# Patient Record
Sex: Male | Born: 1975
Health system: Southern US, Community
[De-identification: ages and names within clinical notes are randomized; demographics above are authoritative.]

## PROBLEM LIST (undated history)

## (undated) DIAGNOSIS — T884XXA Failed or difficult intubation, initial encounter: Secondary | ICD-10-CM

## (undated) DIAGNOSIS — M1990 Unspecified osteoarthritis, unspecified site: Secondary | ICD-10-CM

## (undated) DIAGNOSIS — I10 Essential (primary) hypertension: Secondary | ICD-10-CM

## (undated) HISTORY — PX: KIDNEY SURGERY: SHX687

---
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
## 2019-02-06 ENCOUNTER — Emergency Department (HOSPITAL_BASED_OUTPATIENT_CLINIC_OR_DEPARTMENT_OTHER): Payer: Managed Care, Other (non HMO)

## 2019-02-06 ENCOUNTER — Other Ambulatory Visit: Payer: Self-pay

## 2019-02-06 ENCOUNTER — Emergency Department (HOSPITAL_BASED_OUTPATIENT_CLINIC_OR_DEPARTMENT_OTHER)
Admission: EM | Admit: 2019-02-06 | Discharge: 2019-02-06 | Disposition: A | Discharge status: Home / Self Care | Payer: Managed Care, Other (non HMO) | Attending: Emergency Medicine | Admitting: Emergency Medicine

## 2019-02-06 ENCOUNTER — Encounter (HOSPITAL_BASED_OUTPATIENT_CLINIC_OR_DEPARTMENT_OTHER): Payer: Self-pay

## 2019-02-06 DIAGNOSIS — M25551 Pain in right hip: Secondary | ICD-10-CM | POA: Insufficient documentation

## 2019-02-06 DIAGNOSIS — F1210 Cannabis abuse, uncomplicated: Secondary | ICD-10-CM | POA: Diagnosis not present

## 2019-02-06 DIAGNOSIS — M9140 Coxa magna, unspecified hip: Secondary | ICD-10-CM | POA: Diagnosis present

## 2019-02-06 DIAGNOSIS — R1030 Lower abdominal pain, unspecified: Secondary | ICD-10-CM | POA: Diagnosis present

## 2019-02-06 DIAGNOSIS — F17210 Nicotine dependence, cigarettes, uncomplicated: Secondary | ICD-10-CM | POA: Diagnosis not present

## 2019-02-06 DIAGNOSIS — Q6589 Other specified congenital deformities of hip: Secondary | ICD-10-CM | POA: Diagnosis not present

## 2019-02-06 DIAGNOSIS — M9141 Coxa magna, right hip: Secondary | ICD-10-CM | POA: Diagnosis not present

## 2019-02-06 MED ORDER — METHOCARBAMOL 500 MG PO TABS
1000.0000 mg | ORAL_TABLET | Freq: Once | ORAL | Status: AC
  Administered 2019-02-06: 15:00:00 1000 mg via ORAL
  Filled 2019-02-06: qty 2

## 2019-02-06 MED ORDER — METHOCARBAMOL 500 MG PO TABS: 1000.0000 mg | ORAL_TABLET | Freq: Two times a day (BID) | ORAL | 0 refills | Status: AC

## 2019-02-06 MED ORDER — NAPROXEN 500 MG PO TABS: 500.0000 mg | ORAL_TABLET | Freq: Two times a day (BID) | ORAL | 0 refills | Status: DC

## 2019-02-06 MED FILL — METHOCARBAMOL 500 MG TABLET: 500 | 10 days supply | Qty: 40 | Fill #0

## 2019-02-06 MED FILL — NAPROXEN 500 MG TABLET: 500 | 15 days supply | Qty: 30 | Fill #0

## 2019-02-06 NOTE — ED Triage Notes (Signed)
Pt c/o right groin pain x 4 days-started while stepping out of car-NAD-to triage in w/c

## 2019-02-06 NOTE — Discharge Instructions (Addendum)
Please take Tylenol (acetaminophen) to relieve your pain.  You may take tylenol, up to 1,000 mg (two extra strength pills).  Do not take more than 3,000 mg tylenol in a 24 hour period.  Please check all medication labels as many medications such as pain and cold medications may contain tylenol. Please do not drink alcohol while taking this medication.   I have given you a prescription for  naproxen today.  Naproxen is a NSAID medication and you should not take it with other NSAIDs.  Examples of other NSAIDS include motrin, ibuprofen, and Voltaren.  Please monitor your bowel movements for dark, tarry, sticky stools. If you have any bowel movements like this you need to stop taking mobic and call your doctor as this may represent a stomach ulcer from taking NSAIDS.

## 2019-02-06 NOTE — ED Provider Notes (Signed)
[facility] EMERGENCY DEPARTMENT Provider Note   CSN: [id number] Arrival date & time: 02/06/19  1257    History   Chief Complaint Chief Complaint  Patient presents with  . Groin Pain    HPI Ronald Morales is a 43 y.o. male with no significant past medical history who presents today for evaluation of right groin pain.  He reports that on Friday he was stepping up into his jeep when he felt a sudden, sharp, pain in his right sided groin.  He has been trying ibuprofen, last dose was 400 mg last night, at home without significant relief.  He states that he has been attempting stretching however that continues to hurt.  He states that his testicles do not hurt except for 1 time when he was palpating them he felt like the right side was slightly sore.  He denies any dysuria.  He has been able to urinate without difficulty.  No fevers.  No nausea vomiting or diarrhea.  He denies history of hernia.      HPI  History reviewed. No pertinent past medical history.  Patient Active Problem List   Diagnosis Date Noted  . Hip dysplasia 02/06/2019  . 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 KIDNEY SURGERY          Home Medications    Prior to Admission medications   Medication Sig Start Date End Date Taking? Authorizing Provider  methocarbamol (ROBAXIN) 500 MG tablet Take 2 tablets (1,000 mg total) by mouth 2 (two) times daily for 10 days. 02/06/19 02/16/19  Lorin Glass, PA-C  naproxen (NAPROSYN) 500 MG tablet Take 1 tablet (500 mg total) by mouth 2 (two) times daily. 02/06/19   Lorin Glass, PA-C    Family History No family history on file.  Social History Social History   Tobacco Use  . Smoking status: Current Every Day Smoker    Types: Cigarettes  . Smokeless tobacco: Never Used  Substance Use Topics  . Alcohol use: Yes    Comment: occ  . Drug use: Yes    Types: Marijuana     Allergies   Patient has  no known allergies.   Review of Systems Review of Systems  Constitutional: Negative for chills and fever.  Respiratory: Negative for chest tightness.   Gastrointestinal: Negative for abdominal pain, constipation, diarrhea, nausea and vomiting.  Genitourinary: Negative for dysuria, flank pain, genital sores, hematuria, penile pain, testicular pain (Right one was sore when he palpated it, however does not otherwise hurt. ) and urgency.  Musculoskeletal: Negative for back pain and neck pain.  Neurological: Negative for weakness.  All other systems reviewed and are negative.    Physical Exam Updated Vital Signs BP (!) 153/97 (BP Location: Right Arm)   Pulse 77   Temp 98.1 F (36.7 C) (Oral)   Resp 14   Ht 5\' 11"  (1.803 m)   Wt 74.8 kg   SpO2 100%   BMI 23.01 kg/m   Physical Exam Vitals signs and nursing note reviewed. Exam conducted with a chaperone present Inocente Salles, Male RN).  Constitutional:      General: He is not in acute distress.    Appearance: He is well-developed. He is not diaphoretic.  HENT:     Head: Normocephalic and atraumatic.     Mouth/Throat:     Mouth: Mucous membranes are moist.  Eyes:     General: No scleral icterus.  Right eye: No discharge.        Left eye: No discharge.     Conjunctiva/sclera: Conjunctivae normal.  Neck:     Musculoskeletal: Normal range of motion.  Cardiovascular:     Rate and Rhythm: Normal rate and regular rhythm.     Pulses: Normal pulses.     Heart sounds: Normal heart sounds.  Pulmonary:     Effort: Pulmonary effort is normal. No respiratory distress.     Breath sounds: Normal breath sounds. No stridor.  Abdominal:     General: Abdomen is flat. There is no distension.     Tenderness: There is no abdominal tenderness. There is no guarding.     Hernia: No hernia is present.     Comments: No inguinal or femoral hernias palpated.  Genitourinary:    Comments: Bilateral testicles are descended with out TTP, masses, abnormal  redness or swelling.  Musculoskeletal:        General: No deformity.     Comments: Right inguinal area is generally tender to palpation.  There is pain in the right hip with internal and external rotation of the right leg.  He is able to flex his right hip, knee, and ankle.  He is able to straighten his right hip however has pain with generalized hip range of motion in the medial inguinal area.  Skin:    General: Skin is warm and dry.  Neurological:     General: No focal deficit present.     Mental Status: He is alert.     Sensory: No sensory deficit (Sensation intact to right leg ).     Motor: No abnormal muscle tone.  Psychiatric:        Mood and Affect: Mood normal.        Behavior: Behavior normal.      ED Treatments / Results  Labs (all labs ordered are listed, but only abnormal results are displayed) Labs Reviewed - No data to display  EKG None  Radiology Dg Hip Unilat With Pelvis 2-3 Views Right  Result Date: 02/06/2019 CLINICAL DATA:  Right hip pain since Friday. EXAM: DG HIP (WITH OR WITHOUT PELVIS) 2-3V RIGHT COMPARISON:  None. FINDINGS: Both hips are normally located. Minimal degenerative changes bilaterally. Mild hip dysplasia bilaterally with mild coxa magna deformity with prominent and shortened femoral neck is. This could predispose to femoroacetabular impingement. No acute bony findings or plain film evidence of AVN. The pubic symphysis and SI joints are intact. No pelvic fractures or bone lesions. IMPRESSION: 1. 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 This could predispose to femoroacetabular impingement. 2. No acute bony findings. Electronically Signed   By: Rudie MeyerP.  Gallerani M.D.   On: 02/06/2019 15:45    Procedures Procedures (including critical care time)  Medications Ordered in ED Medications  methocarbamol (ROBAXIN) tablet 1,000 mg (1,000 mg Oral Given 02/06/19 1456)     Initial Impression / Assessment and Plan / ED Course  I have  reviewed the triage vital signs and the nursing notes.  Pertinent labs & imaging results that were available during my care of the patient were reviewed by me and considered in my medical decision making (see chart for details).       Patient presents today for evaluation of right hip pain that occurred after he was getting into a vehicle.  On exam he has pain along the anterior portion of the right hip, and his right leg is neurovascularly intact.  He was given  a dose of Robaxin for presumed muscle spasm/sprain.  X-rays were obtained showing coxa magna deformity with congenital hip dysplasia bilaterally.  No evidence of acute fracture.  I suspect that his pain is related to muscle strain which he is predisposition for based on his hip dysplasia, however he does also have increased risk of impingement which may cause similar pain.  He is given crutches while in the emergency room so that he does not have to bear full weight on the hip with instructions to continue range of motion.  He is given prescriptions for Naprosyn and Robaxin.  He is advised not to drive or operate heavy machinery while taking Robaxin.  He is given follow-up with orthopedics.  He does not have any testicular pain or evidence of testicular torsion, his pain seems to be primarily located in his right hip.  No hernia palpated.  Return precautions were discussed with patient who states their understanding.  At the time of discharge patient denied any unaddressed complaints or concerns.  Patient is agreeable for discharge home.   Final Clinical Impressions(s) / ED Diagnoses   Final diagnoses:  Right hip pain  Hip dysplasia  Coxa magna, unspecified laterality    ED Discharge Orders         Ordered    naproxen (NAPROSYN) 500 MG tablet  2 times daily     02/06/19 1634    methocarbamol (ROBAXIN) 500 MG tablet  2 times daily     02/06/19 1634           Ronald ClayHammond, [place] Wor[place] W, PA-C 02/06/19 2217    Linwood DibblesKnapp, Jon, MD  02/07/19 [id number]

## 2019-02-06 NOTE — ED Notes (Signed)
Patient verbalizes understanding of discharge instructions. Opportunity for questioning and answers were provided. Armband removed by staff, pt discharged from ED.  

## 2019-02-10 ENCOUNTER — Other Ambulatory Visit (HOSPITAL_COMMUNITY): Payer: Self-pay | Admitting: Sports Medicine

## 2019-02-10 ENCOUNTER — Encounter (HOSPITAL_COMMUNITY): Payer: Self-pay | Admitting: Emergency Medicine

## 2019-02-10 ENCOUNTER — Ambulatory Visit (HOSPITAL_COMMUNITY)
Admission: RE | Admit: 2019-02-10 | Discharge: 2019-02-10 | Disposition: A | Discharge status: Home / Self Care | Payer: Managed Care, Other (non HMO) | Source: Ambulatory Visit | Attending: Sports Medicine | Admitting: Sports Medicine

## 2019-02-10 ENCOUNTER — Other Ambulatory Visit: Payer: Self-pay

## 2019-02-10 ENCOUNTER — Inpatient Hospital Stay (HOSPITAL_COMMUNITY)
Admission: EM | Admit: 2019-02-10 | Discharge: 2019-02-13 | DRG: 566 | Disposition: A | Discharge status: Home / Self Care | Payer: Managed Care, Other (non HMO) | Attending: Internal Medicine | Admitting: Internal Medicine

## 2019-02-10 DIAGNOSIS — Z20828 Contact with and (suspected) exposure to other viral communicable diseases: Secondary | ICD-10-CM | POA: Diagnosis present

## 2019-02-10 DIAGNOSIS — W2209XA Striking against other stationary object, initial encounter: Secondary | ICD-10-CM | POA: Diagnosis present

## 2019-02-10 DIAGNOSIS — M25551 Pain in right hip: Secondary | ICD-10-CM | POA: Insufficient documentation

## 2019-02-10 DIAGNOSIS — S73101A Unspecified sprain of right hip, initial encounter: Secondary | ICD-10-CM | POA: Diagnosis present

## 2019-02-10 DIAGNOSIS — M25451 Effusion, right hip: Principal | ICD-10-CM | POA: Diagnosis present

## 2019-02-10 DIAGNOSIS — M24051 Loose body in right hip: Secondary | ICD-10-CM | POA: Diagnosis present

## 2019-02-10 DIAGNOSIS — F17210 Nicotine dependence, cigarettes, uncomplicated: Secondary | ICD-10-CM | POA: Diagnosis present

## 2019-02-10 DIAGNOSIS — R262 Difficulty in walking, not elsewhere classified: Secondary | ICD-10-CM | POA: Diagnosis not present

## 2019-02-10 DIAGNOSIS — Z79899 Other long term (current) drug therapy: Secondary | ICD-10-CM

## 2019-02-10 DIAGNOSIS — I10 Essential (primary) hypertension: Secondary | ICD-10-CM | POA: Diagnosis present

## 2019-02-10 DIAGNOSIS — Z8249 Family history of ischemic heart disease and other diseases of the circulatory system: Secondary | ICD-10-CM | POA: Diagnosis not present

## 2019-02-10 LAB — CBC WITH DIFFERENTIAL/PLATELET
Abs Immature Granulocytes: 0.03 10*3/uL (ref 0.00–0.07)
Basophils Absolute: 0 10*3/uL (ref 0.0–0.1)
Basophils Relative: 0 %
Eosinophils Absolute: 0.1 10*3/uL (ref 0.0–0.5)
Eosinophils Relative: 1 %
HCT: 43.7 % (ref 39.0–52.0)
Hemoglobin: 14.3 g/dL (ref 13.0–17.0)
Immature Granulocytes: 0 %
Lymphocytes Relative: 14 %
Lymphs Abs: 1.6 10*3/uL (ref 0.7–4.0)
MCH: 29.4 pg (ref 26.0–34.0)
MCHC: 32.7 g/dL (ref 30.0–36.0)
MCV: 89.7 fL (ref 80.0–100.0)
Monocytes Absolute: 0.9 10*3/uL (ref 0.1–1.0)
Monocytes Relative: 8 %
Neutro Abs: 9 10*3/uL — ABNORMAL HIGH (ref 1.7–7.7)
Neutrophils Relative %: 77 %
Platelets: 323 10*3/uL (ref 150–400)
RBC: 4.87 MIL/uL (ref 4.22–5.81)
RDW: 13 % (ref 11.5–15.5)
WBC: 11.7 10*3/uL — ABNORMAL HIGH (ref 4.0–10.5)
nRBC: 0 % (ref 0.0–0.2)

## 2019-02-10 LAB — SYNOVIAL CELL COUNT + DIFF, W/ CRYSTALS
Crystals, Fluid: NONE SEEN
Monocyte-Macrophage-Synovial Fluid: 3 % — ABNORMAL LOW (ref 50–90)
Neutrophil, Synovial: 97 % — ABNORMAL HIGH (ref 0–25)
WBC, Synovial: 55000 /mm3 — ABNORMAL HIGH (ref 0–200)

## 2019-02-10 LAB — COMPREHENSIVE METABOLIC PANEL
ALT: 24 U/L (ref 0–44)
AST: 25 U/L (ref 15–41)
Albumin: 4.5 g/dL (ref 3.5–5.0)
Alkaline Phosphatase: 75 U/L (ref 38–126)
Anion gap: 12 (ref 5–15)
BUN: 17 mg/dL (ref 6–20)
CO2: 26 mmol/L (ref 22–32)
Calcium: 9.6 mg/dL (ref 8.9–10.3)
Chloride: 99 mmol/L (ref 98–111)
Creatinine, Ser: 1.03 mg/dL (ref 0.61–1.24)
GFR calc Af Amer: >60 mL/min (ref >60)
GFR calc non Af Amer: >60 mL/min (ref >60)
Glucose, Bld: 132 mg/dL — ABNORMAL HIGH (ref 70–99)
Potassium: 3.7 mmol/L (ref 3.5–5.1)
Sodium: 137 mmol/L (ref 135–145)
Total Bilirubin: 0.4 mg/dL (ref 0.3–1.2)
Total Protein: 8.5 g/dL — ABNORMAL HIGH (ref 6.5–8.1)

## 2019-02-10 LAB — LACTIC ACID, PLASMA: Lactic Acid, Venous: 1.5 mmol/L (ref 0.5–1.9)

## 2019-02-10 LAB — SEDIMENTATION RATE: Sed Rate: 56 mm/hr — ABNORMAL HIGH (ref 0–16)

## 2019-02-10 LAB — C-REACTIVE PROTEIN: CRP: 13.5 mg/dL — ABNORMAL HIGH (ref <1.0)

## 2019-02-10 MED ORDER — HYDROMORPHONE HCL 1 MG/ML IJ SOLN
1.0000 mg | Freq: Once | INTRAMUSCULAR | Status: AC
  Administered 2019-02-10: 1 mg via INTRAVENOUS
  Filled 2019-02-10: qty 1

## 2019-02-10 MED ORDER — IOHEXOL 180 MG/ML  SOLN: 5.0000 mL | Freq: Once | INTRAMUSCULAR | Status: DC

## 2019-02-10 MED ORDER — SODIUM CHLORIDE 0.9 % IV BOLUS
1000.0000 mL | Freq: Once | INTRAVENOUS | Status: AC
  Administered 2019-02-10: 1000 mL via INTRAVENOUS

## 2019-02-10 MED ORDER — VANCOMYCIN HCL 10 G IV SOLR
1750.0000 mg | Freq: Once | INTRAVENOUS | Status: AC
  Administered 2019-02-11: 1750 mg via INTRAVENOUS
  Filled 2019-02-10: qty 1750

## 2019-02-10 MED ORDER — SODIUM CHLORIDE 0.9 % IV SOLN
2.0000 g | Freq: Once | INTRAVENOUS | Status: AC
  Administered 2019-02-11: 2 g via INTRAVENOUS
  Filled 2019-02-10: qty 20

## 2019-02-10 NOTE — Progress Notes (Signed)
A consult was received from an ED physician for vancomycin per pharmacy dosing.  The patient's profile has been reviewed for ht/wt/allergies/indication/available labs.   A one time order has been placed for Vancomycin 1750 mg.  Further antibiotics/pharmacy consults should be ordered by admitting physician if indicated.                       Thank you, Dorrene German 02/10/2019  11:54 PM

## 2019-02-10 NOTE — ED Notes (Addendum)
Pt refusing rectal temp while provider was in room. Provider and staff explained importance. Pt still refused.

## 2019-02-10 NOTE — H&P (Signed)
History and Physical    Ronald Morales AST:[id number] DOB: [birth year] DOA: 02/10/2019  PCP: Patient, No Pcp Per  Patient coming from: Home  I have personally briefly reviewed patient's old medical records in [place]  Chief Complaint: Right hip pain  HPI: Ronald Morales is a 43 y.o. male with no significant medical history who presents to the ED for further evaluation of a right hip pain due to effusion.  Patient states he first developed right-sided hip/groin pain on the night of 02/03/2019 when he was stepping up into his jeep and had sudden onset of sharp pain in his right groin.  He had persistent symptoms however was able to continue to bear weight and walk on his own.  He was using NSAIDs without significant relief.  He presented to [facility] ED on 02/06/2019.  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  He was provided crutches and discharged with prescriptions for naproxen and methocarbamol.  Patient states he has had progressive worsening right-sided hip pain and has been unable to ambulate on his own power.  His range of motion has progressively worsened.  He was seen by orthopedics on 02/09/2019 at which time joint aspiration was reportedly unsuccessful in the office.  He reportedly had an MRI performed which showed a large right-sided hip effusion (records not available).  He was sent for IR guided right hip aspiration which was performed on 02/10/2019 with approximately 20 mL's of yellow cloudy fluid aspirated and subsequently sent to the ED.  Labs show 55,000 WBCs with 97% neutrophils, no crystals seen.  Gram stain showed abundant WBCs without organisms seen.  Body fluid culture, uric acid, protein, glucose were obtained and in process.  Patient denies any associated subjective fevers, chest pain, dyspnea, abdominal pain, dysuria, rash, or other affected joints.  He is sexually active and  admits to not using protection.  He denies any history of STI.  ED Course:  Initial vitals showed BP 153/93, pulse 78, RR 20, temp 99.1 Fahrenheit, SPO2 100% on room air.  Labs are notable for WBC 11.7, hemoglobin 14.3, platelets 323,000, potassium 3.7, BUN 17, creatinine 1.03, CRP 13.5, ESR 56, lactic acid 1.5. Blood cultures were drawn and pending.  SARS-CoV-2 test is obtained and pending.  EDP discussed the case with orthopedics who recommended hospitalist admission for IV antibiotics and will see in a.m. for possible need for washout.  Patient was given 1 L normal saline, IV vancomycin and ceftriaxone and the hospitalist service was consulted to admit.   Review of Systems: All systems reviewed and are negative except as documented in history of present illness above.   History reviewed. No pertinent past medical history.  Past Surgical History:  Procedure Laterality Date   KIDNEY SURGERY      Social History:  reports that he has been smoking cigarettes. He has never used smokeless tobacco. He reports current alcohol use. He reports current drug use. Drug: Marijuana.  No Known Allergies  No family history on file.   Prior to Admission medications   Medication Sig Start Date End Date Taking? Authorizing Provider  acetaminophen (TYLENOL) 325 MG tablet Take 650 mg by mouth every 6 (six) hours as needed for mild pain.   Yes [provider]  HYDROcodone-acetaminophen (NORCO/VICODIN) 5-325 MG tablet Take 1 tablet by mouth every 6 (six) hours as needed for moderate pain.   Yes [provider]  ibuprofen (ADVIL) 200 MG  tablet Take 400 mg by mouth every 6 (six) hours as needed for moderate pain.   Yes [provider]  Ibuprofen-diphenhydrAMINE HCl (IBUPROFEN PM) 200-25 MG CAPS Take 2 capsules by mouth at bedtime as needed and may repeat dose one time if needed (pain and sleep).   Yes [provider]  methocarbamol (ROBAXIN) 500 MG tablet Take 2  tablets (1,000 mg total) by mouth 2 (two) times daily for 10 days. Patient taking differently: Take 1,000 mg by mouth every 8 (eight) hours as needed for muscle spasms.  02/06/19 02/16/19 Yes Lorin Glass, PA-C  naproxen (NAPROSYN) 500 MG tablet Take 1 tablet (500 mg total) by mouth 2 (two) times daily. 02/06/19  Yes Lorin Glass, [place]    Physical Exam: Vitals:   02/10/19 2202 02/10/19 2339 02/11/19 0055  BP: (!) 153/93 (!) 162/93 (!) 173/103  Pulse: 78 78 64  Resp: '20 19 20  '$ Temp: 99.1 F (37.3 C)  98.4 F (36.9 C)  TempSrc: Oral  Oral  SpO2: 100% 100% 100%    Constitutional: Resting supine in bed, NAD, calm, comfortable Eyes: PERRL, lids and conjunctivae normal ENMT: Mucous membranes are moist. Posterior pharynx clear of any exudate or lesions.Normal dentition.  Neck: normal, supple, no masses. Respiratory: clear to auscultation bilaterally, no wheezing, no crackles. Normal respiratory effort. No accessory muscle use.  Cardiovascular: Regular rate and rhythm, no murmurs / rubs / gallops. No extremity edema. 2+ pedal pulses. Abdomen: no tenderness, no masses palpated. No hepatosplenomegaly. Bowel sounds positive.  Musculoskeletal: no clubbing / cyanosis.  ROM at the right hip diminished due to pain and effusion.  Tender to palpation at the right hip.  ROM of all other extremities intact. Skin: no rashes, lesions, ulcers. No induration Neurologic: CN 2-12 grossly intact. Sensation intact, Strength 5/5 in all extremities except RLE which is diminished due to pain at the right hip.  Psychiatric: Normal judgment and insight. Alert and oriented x 3. Normal mood.    Labs on Admission: I have personally reviewed following labs and imaging studies  CBC: Recent Labs  Lab 02/10/19 2214  WBC 11.7*  NEUTROABS 9.0*  HGB 14.3  HCT 43.7  MCV 89.7  PLT 366   Basic Metabolic Panel: Recent Labs  Lab 02/10/19 2214  NA 137  K 3.7  CL 99  CO2 26  GLUCOSE 132*  BUN 17    CREATININE 1.03  CALCIUM 9.6   GFR: Estimated Creatinine Clearance: 100.9 mL/min (by C-G formula based on SCr of 1.03 mg/dL). Liver Function Tests: Recent Labs  Lab 02/10/19 2214  AST 25  ALT 24  ALKPHOS 75  BILITOT 0.4  PROT 8.5*  ALBUMIN 4.5   No results for input(s): LIPASE, AMYLASE in the last 168 hours. No results for input(s): AMMONIA in the last 168 hours. Coagulation Profile: No results for input(s): INR, PROTIME in the last 168 hours. Cardiac Enzymes: No results for input(s): CKTOTAL, CKMB, CKMBINDEX, TROPONINI in the last 168 hours. BNP (last 3 results) No results for input(s): PROBNP in the last 8760 hours. HbA1C: No results for input(s): HGBA1C in the last 72 hours. CBG: No results for input(s): GLUCAP in the last 168 hours. Lipid Profile: No results for input(s): CHOL, HDL, LDLCALC, TRIG, CHOLHDL, LDLDIRECT in the last 72 hours. Thyroid Function Tests: No results for input(s): TSH, T4TOTAL, FREET4, T3FREE, THYROIDAB in the last 72 hours. Anemia Panel: No results for input(s): VITAMINB12, FOLATE, FERRITIN, TIBC, IRON, RETICCTPCT in the last 72 hours. Urine  analysis: No results found for: COLORURINE, APPEARANCEUR, LABSPEC, PHURINE, GLUCOSEU, HGBUR, BILIRUBINUR, KETONESUR, PROTEINUR, UROBILINOGEN, NITRITE, LEUKOCYTESUR  Radiological Exams on Admission: Dg Fluoro Guided Needle Plc Aspiration/injection Loc  Result Date: 02/10/2019 CLINICAL DATA:  Right hip pain, question of infection versus effusion EXAM: RIGHT HIP ASPIRATION UNDER FLUOROSCOPY FLUOROSCOPY TIME:  Fluoroscopy Time:  1 minutes Radiation Exposure Index (if provided by the fluoroscopic device): Number of Acquired Spot Images: 0 PROCEDURE: Overlying skin prepped with Betadine, draped in the usual sterile fashion, and infiltrated locally with buffered Lidocaine. Curved 22 gauge spinal needle advanced to the superolateral margin of the right femoral head. 1 ml of Lidocaine injected easily. Diagnostic  injection of iodinated contrast demonstrates intra-articular spread without intravascular component. There was immediate return of yellow fluid. Approximately 20 mL of cloudy yellow fluid was able to be aspirated from the right hip joint. IMPRESSION: Technically successful right hip aspiration under fluoroscopic guidance. Approximately 20 mL of yellow cloudy fluid was aspirated and sent for laboratory evaluation. Electronically Signed   By: Rolm Baptise M.D.   On: 02/10/2019 17:14    EKG: Not performed.  Assessment/Plan Active Problems:   Effusion of right hip  Ronald Morales is a 44 y.o. male with no significant medical history who is admitted for further evaluation management of right hip effusion.  Right hip effusion: S/p IR aspiration 02/10/19. Labs show 55,000 WBCs with 97% neutrophils. No crystals seen to suggest crystal arthropathy.  Gram stain showed abundant WBCs without organisms seen, however due to elevated WBCs will cover empirically with IV antibiotics pending further culture data.  Will add on GC/chlamydia studies. -Continue IV vancomycin and ceftriaxone for now -Follow-up blood and synovial fluid cultures -Orthopedics to see in a.m. for possible washout -Continue pain control with Tylenol, OxyIR, and IV morphine as needed   DVT prophylaxis: SCDs Code Status: Full code, confirmed with patient Family Communication: None present on admission Disposition Plan: Pending clinical progress Consults called: Orthopedics consulted by EDP Admission status: Inpatient for further evaluation and management of right hip effusion concerning for possible septic arthritis requiring empiric IV antibiotic therapy pending further culture data and orthopedic evaluation for possible surgical intervention.   Zada Finders MD [facility]  If 7PM-7AM, please contact night-coverage [email or web address]  02/11/2019, 1:03 AM

## 2019-02-10 NOTE — ED Triage Notes (Signed)
Provider made contact in triage stating that pt has rt hip aspiration yesterday for treatment of a very large effusion unsure if the cause is infection or inflammation, PA called and stated that they would fax over MRI  done yesterday and that Pt need to be f/u with blood work.   Pt present with continued pain. Document/Medical Records faxed to [phone].

## 2019-02-10 NOTE — ED Provider Notes (Signed)
[facility]-EMERGENCY DEPT Provider Note   CSN: [id number] Arrival date & time: 02/10/19  2134    History   Chief Complaint Chief Complaint  Patient presents with  . Leg Pain    HPI Elizabeth Palauntonieo Bain is a 43 y.o. male.     HPI  43 year old male presents due to right hip pain. Had an MRI done yesterday that showed large hip effusion. He's been having pain for 1 week. Twisted it getting out of a car with severe pain since. Using crutches. No fevers, chills, STI symptoms or history. Has had pain in this hip before randomly that comes and goes but never lasted this long. Ortho sent him for aspiration today and the WBC was 55K with no organisms or crystals. Sent here for labs.   History reviewed. No pertinent past medical history.  Patient Active Problem List   Diagnosis Date Noted  . Hip dysplasia 02/06/2019  . 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 KIDNEY SURGERY          Home Medications    Prior to Admission medications   Medication Sig Start Date End Date Taking? Authorizing Provider  acetaminophen (TYLENOL) 325 MG tablet Take 650 mg by mouth every 6 (six) hours as needed for mild pain.   Yes [provider]  HYDROcodone-acetaminophen (NORCO/VICODIN) 5-325 MG tablet Take 1 tablet by mouth every 6 (six) hours as needed for moderate pain.   Yes [provider]  ibuprofen (ADVIL) 200 MG tablet Take 400 mg by mouth every 6 (six) hours as needed for moderate pain.   Yes [provider]  Ibuprofen-diphenhydrAMINE HCl (IBUPROFEN PM) 200-25 MG CAPS Take 2 capsules by mouth at bedtime as needed and may repeat dose one time if needed (pain and sleep).   Yes [provider]  methocarbamol (ROBAXIN) 500 MG tablet Take 2 tablets (1,000 mg total) by mouth 2 (two) times daily for 10 days. Patient taking differently: Take 1,000 mg by mouth every 8 (eight) hours as needed for muscle spasms.   02/06/19 02/16/19 Yes Cristina GongHammond, Elizabeth W, PA-C  naproxen (NAPROSYN) 500 MG tablet Take 1 tablet (500 mg total) by mouth 2 (two) times daily. 02/06/19  Yes Cristina GongHammond, Elizabeth W, PA-C    Family History No family history on file.  Social History Social History   Tobacco Use  . Smoking status: Current Every Day Smoker    Types: Cigarettes  . Smokeless tobacco: Never Used  Substance Use Topics  . Alcohol use: Yes    Comment: occ  . Drug use: Yes    Types: Marijuana     Allergies   Patient has no known allergies.   Review of Systems Review of Systems  Constitutional: Negative for chills and fever.  Musculoskeletal: Positive for arthralgias.  All other systems reviewed and are negative.    Physical Exam Updated Vital Signs BP (!) 153/93   Pulse 78   Temp 99.1 F (37.3 C) (Oral)   Resp 20   SpO2 100%   Physical Exam Vitals signs and nursing note reviewed.  Constitutional:      Appearance: He is well-developed.  HENT:     Head: Normocephalic and atraumatic.     Right Ear: External ear normal.     Left Ear: External ear normal.     Nose: Nose normal.  Eyes:     General:        Right eye: No discharge.  Left eye: No discharge.  Neck:     Musculoskeletal: Neck supple.  Cardiovascular:     Rate and Rhythm: Normal rate and regular rhythm.     Heart sounds: Normal heart sounds.  Pulmonary:     Effort: Pulmonary effort is normal.     Breath sounds: Normal breath sounds.  Abdominal:     Palpations: Abdomen is soft.     Tenderness: There is no abdominal tenderness.  Skin:    General: Skin is warm and dry.  Neurological:     Mental Status: He is alert.  Psychiatric:        Mood and Affect: Mood is not anxious.      ED Treatments / Results  Labs (all labs ordered are listed, but only abnormal results are displayed) Labs Reviewed  COMPREHENSIVE METABOLIC PANEL - Abnormal; Notable for the following components:      Result Value   Glucose, Bld 132 (*)     Total Protein 8.5 (*)    All other components within normal limits  CBC WITH DIFFERENTIAL/PLATELET - Abnormal; Notable for the following components:   WBC 11.7 (*)    Neutro Abs 9.0 (*)    All other components within normal limits  C-REACTIVE PROTEIN - Abnormal; Notable for the following components:   CRP 13.5 (*)    All other components within normal limits  CULTURE, BLOOD (ROUTINE X 2)  CULTURE, BLOOD (ROUTINE X 2)  SARS CORONAVIRUS 2 (HOSPITAL ORDER, PERFORMED IN [facility])  LACTIC ACID, PLASMA  LACTIC ACID, PLASMA  SEDIMENTATION RATE    EKG None  Radiology Dg Fluoro Guided Needle Plc Aspiration/injection Loc  Result Date: 02/10/2019 CLINICAL DATA:  Right hip pain, question of infection versus effusion EXAM: RIGHT HIP ASPIRATION UNDER FLUOROSCOPY FLUOROSCOPY TIME:  Fluoroscopy Time:  1 minutes Radiation Exposure Index (if provided by the fluoroscopic device): Number of Acquired Spot Images: 0 PROCEDURE: Overlying skin prepped with Betadine, draped in the usual sterile fashion, and infiltrated locally with buffered Lidocaine. Curved 22 gauge spinal needle advanced to the superolateral margin of the right femoral head. 1 ml of Lidocaine injected easily. Diagnostic injection of iodinated contrast demonstrates intra-articular spread without intravascular component. There was immediate return of yellow fluid. Approximately 20 mL of cloudy yellow fluid was able to be aspirated from the right hip joint. IMPRESSION: Technically successful right hip aspiration under fluoroscopic guidance. Approximately 20 mL of yellow cloudy fluid was aspirated and sent for laboratory evaluation. Electronically Signed   By: Charlett NoseKevin  Dover M.D.   On: 02/10/2019 17:14    Procedures Procedures (including critical care time)  Medications Ordered in ED Medications  cefTRIAXone (ROCEPHIN) 2 g in sodium chloride 0.9 % 100 mL IVPB (has no administration in time range)  sodium chloride 0.9 % bolus  1,000 mL (1,000 mLs Intravenous New Bag/Given 02/10/19 2231)  HYDROmorphone (DILAUDID) injection 1 mg (1 mg Intravenous Given 02/10/19 2223)     Initial Impression / Assessment and Plan / ED Course  I have reviewed the triage vital signs and the nursing notes.  Pertinent labs & imaging results that were available during my care of the patient were reviewed by me and considered in my medical decision making (see chart for details).        Labs show mildly elevated WBC but significantly elevated CRP.  I discussed with Dr. Lequita HaltAluisio, who asked for hospitalist admission for IV antibiotics.  Ortho will see in the morning to determine if he will need a  washout. Dr. Posey Pronto to admit.  Corneluis Allston was evaluated in Emergency Department on 02/10/2019 for the symptoms described in the history of present illness. He was evaluated in the context of the global COVID-19 pandemic, which necessitated consideration that the patient might be at risk for infection with the SARS-CoV-2 virus that causes COVID-19. Institutional protocols and algorithms that pertain to the evaluation of patients at risk for COVID-19 are in a state of rapid change based on information released by regulatory bodies including the CDC and federal and state organizations. These policies and algorithms were followed during the patient's care in the ED.   Final Clinical Impressions(s) / ED Diagnoses   Final diagnoses:  Effusion of right hip    ED Discharge Orders    None       Sherwood Gambler, MD 02/10/19 2348

## 2019-02-11 ENCOUNTER — Encounter (HOSPITAL_COMMUNITY): Payer: Self-pay | Admitting: Internal Medicine

## 2019-02-11 DIAGNOSIS — M25551 Pain in right hip: Secondary | ICD-10-CM

## 2019-02-11 LAB — CBC
HCT: 39.4 % (ref 39.0–52.0)
Hemoglobin: 12.7 g/dL — ABNORMAL LOW (ref 13.0–17.0)
MCH: 29.6 pg (ref 26.0–34.0)
MCHC: 32.2 g/dL (ref 30.0–36.0)
MCV: 91.8 fL (ref 80.0–100.0)
Platelets: 275 10*3/uL (ref 150–400)
RBC: 4.29 MIL/uL (ref 4.22–5.81)
RDW: 13.2 % (ref 11.5–15.5)
WBC: 10.5 10*3/uL (ref 4.0–10.5)
nRBC: 0 % (ref 0.0–0.2)

## 2019-02-11 LAB — BASIC METABOLIC PANEL
Anion gap: 9 (ref 5–15)
BUN: 15 mg/dL (ref 6–20)
CO2: 24 mmol/L (ref 22–32)
Calcium: 8.6 mg/dL — ABNORMAL LOW (ref 8.9–10.3)
Chloride: 105 mmol/L (ref 98–111)
Creatinine, Ser: 0.91 mg/dL (ref 0.61–1.24)
GFR calc Af Amer: >60 mL/min (ref >60)
GFR calc non Af Amer: >60 mL/min (ref >60)
Glucose, Bld: 113 mg/dL — ABNORMAL HIGH (ref 70–99)
Potassium: 4.2 mmol/L (ref 3.5–5.1)
Sodium: 138 mmol/L (ref 135–145)

## 2019-02-11 LAB — SARS CORONAVIRUS 2 BY RT PCR (HOSPITAL ORDER, PERFORMED IN ~~LOC~~ HOSPITAL LAB): SARS Coronavirus 2: NEGATIVE

## 2019-02-11 LAB — LACTIC ACID, PLASMA: Lactic Acid, Venous: 0.9 mmol/L (ref 0.5–1.9)

## 2019-02-11 LAB — HIV ANTIBODY (ROUTINE TESTING W REFLEX): HIV Screen 4th Generation wRfx: NONREACTIVE

## 2019-02-11 MED ORDER — SODIUM CHLORIDE 0.9 % IV SOLN
2.0000 g | INTRAVENOUS | Status: DC
  Administered 2019-02-11 – 2019-02-12 (×2): 2 g via INTRAVENOUS
  Filled 2019-02-11 (×2): qty 2
  Filled 2019-02-11: qty 20

## 2019-02-11 MED ORDER — ONDANSETRON HCL 4 MG PO TABS: 4.0000 mg | ORAL_TABLET | Freq: Four times a day (QID) | ORAL | Status: DC | PRN

## 2019-02-11 MED ORDER — ONDANSETRON HCL 4 MG/2ML IJ SOLN: 4.0000 mg | Freq: Four times a day (QID) | INTRAMUSCULAR | Status: DC | PRN

## 2019-02-11 MED ORDER — AMLODIPINE BESYLATE 5 MG PO TABS
5.0000 mg | ORAL_TABLET | Freq: Every day | ORAL | Status: DC
  Administered 2019-02-11 – 2019-02-13 (×3): 5 mg via ORAL
  Filled 2019-02-11 (×3): qty 1

## 2019-02-11 MED ORDER — VANCOMYCIN HCL IN DEXTROSE 1-5 GM/200ML-% IV SOLN
1000.0000 mg | Freq: Two times a day (BID) | INTRAVENOUS | Status: DC
  Administered 2019-02-11 – 2019-02-12 (×4): 1000 mg via INTRAVENOUS
  Filled 2019-02-11 (×8): qty 200

## 2019-02-11 MED ORDER — OXYCODONE HCL 5 MG PO TABS
5.0000 mg | ORAL_TABLET | ORAL | Status: DC | PRN
  Administered 2019-02-11 – 2019-02-13 (×3): 5 mg via ORAL
  Filled 2019-02-11 (×3): qty 1

## 2019-02-11 MED ORDER — ACETAMINOPHEN 650 MG RE SUPP: 650.0000 mg | Freq: Four times a day (QID) | RECTAL | Status: DC | PRN

## 2019-02-11 MED ORDER — ACETAMINOPHEN 325 MG PO TABS
650.0000 mg | ORAL_TABLET | Freq: Four times a day (QID) | ORAL | Status: DC | PRN
  Administered 2019-02-12 – 2019-02-13 (×2): 650 mg via ORAL
  Filled 2019-02-11 (×2): qty 2

## 2019-02-11 MED ORDER — MORPHINE SULFATE (PF) 2 MG/ML IV SOLN
1.0000 mg | INTRAVENOUS | Status: DC | PRN
  Administered 2019-02-11 – 2019-02-13 (×8): 2 mg via INTRAVENOUS
  Filled 2019-02-11 (×9): qty 1

## 2019-02-11 NOTE — Progress Notes (Signed)
Patient arrived on the unit at approximately [place]. He is alert and verbally responsive and c/o pain to right hip. No other complaints voiced at this time.

## 2019-02-11 NOTE — ED Notes (Signed)
ED TO INPATIENT HANDOFF REPORT  Name/Age/Gender Ronald Morales 43 y.o. male  Code Status   Home/SNF/Other Home  Chief Complaint sent by pcp; needs labs; Dr. Reynaldo Minium MRI faxed to [phone]   Level of Care/Admitting Diagnosis ED Disposition    ED Disposition Condition [place]: [place] [100102]  Level of Care: Med-Surg [16]  Covid Evaluation: Asymptomatic Screening Protocol (No Symptoms)  Diagnosis: Effusion of right hip [824235]  Admitting Physician: Lenore Cordia [[id number]]  Attending Physician: Lenore Cordia [[id number]]  Estimated length of stay: past midnight tomorrow  Certification:: I certify this patient will need inpatient services for at least 2 midnights  PT Class (Do Not Modify): Inpatient [101]  PT Acc Code (Do Not Modify): Private [1]       Medical History History reviewed. No pertinent past medical history.  Allergies No Known Allergies  IV Location/Drains/Wounds Patient Lines/Drains/Airways Status   Active Line/Drains/Airways    Name:   Placement date:   Placement time:   Site:   Days:   Peripheral IV 02/10/19 Right Antecubital   02/10/19    2217    Antecubital   1   Peripheral IV 02/10/19 Left Antecubital   02/10/19    2220    Antecubital   1          Labs/Imaging Results for orders placed or performed during the hospital encounter of 02/10/19 (from the past 48 hour(s))  Sedimentation rate     Status: Abnormal   Collection Time: 02/10/19 10:08 PM  Result Value Ref Range   Sed Rate 56 (H) 0 - 16 mm/hr    Comment: Performed at [facility], [place] [address] [place], [place] [address]  Comprehensive metabolic panel     Status: Abnormal   Collection Time: 02/10/19 10:14 PM  Result Value Ref Range   Sodium 137 135 - 145 mmol/L   Potassium 3.7 3.5 - 5.1 mmol/L   Chloride 99 98 - 111 mmol/L   CO2 26 22 - 32 mmol/L   Glucose, Bld 132 (H) 70 - 99 mg/dL   BUN 17 6 - 20 mg/dL    Creatinine, Ser 1.03 0.61 - 1.24 mg/dL   Calcium 9.6 8.9 - 10.3 mg/dL   Total Protein 8.5 (H) 6.5 - 8.1 g/dL   Albumin 4.5 3.5 - 5.0 g/dL   AST 25 15 - 41 U/L   ALT 24 0 - 44 U/L   Alkaline Phosphatase 75 38 - 126 U/L   Total Bilirubin 0.4 0.3 - 1.2 mg/dL   GFR calc non Af Amer >60 >60 mL/min   GFR calc Af Amer >60 >60 mL/min   Anion gap 12 5 - 15    Comment: Performed at [facility], [place] [address], [place], [place] [address]  CBC with Differential     Status: Abnormal   Collection Time: 02/10/19 10:14 PM  Result Value Ref Range   WBC 11.7 (H) 4.0 - 10.5 K/uL   RBC 4.87 4.22 - 5.81 MIL/uL   Hemoglobin 14.3 13.0 - 17.0 g/dL   HCT 43.7 39.0 - 52.0 %   MCV 89.7 80.0 - 100.0 fL   MCH 29.4 26.0 - 34.0 pg   MCHC 32.7 30.0 - 36.0 g/dL   RDW 13.0 11.5 - 15.5 %   Platelets 323 150 - 400 K/uL   nRBC 0.0 0.0 - 0.2 %   Neutrophils Relative % 77 %   Neutro Abs 9.0 (  H) 1.7 - 7.7 K/uL   Lymphocytes Relative 14 %   Lymphs Abs 1.6 0.7 - 4.0 K/uL   Monocytes Relative 8 %   Monocytes Absolute 0.9 0.1 - 1.0 K/uL   Eosinophils Relative 1 %   Eosinophils Absolute 0.1 0.0 - 0.5 K/uL   Basophils Relative 0 %   Basophils Absolute 0.0 0.0 - 0.1 K/uL   Immature Granulocytes 0 %   Abs Immature Granulocytes 0.03 0.00 - 0.07 K/uL    Comment: Performed at [facility], [address] [place], [place] [address]  Lactic acid, plasma     Status: None   Collection Time: 02/10/19 10:14 PM  Result Value Ref Range   Lactic Acid, Venous 1.5 0.5 - 1.9 mmol/L    Comment: Performed at [facility], [address] [place], [place] [address]  C-reactive protein     Status: Abnormal   Collection Time: 02/10/19 10:14 PM  Result Value Ref Range   CRP 13.5 (H) <1.0 mg/dL    Comment: Performed at [facility], [address] [place], [place] [address]   Dg Fluoro Guided Needle Plc Aspiration/injection Loc  Result Date:  02/10/2019 CLINICAL DATA:  Right hip pain, question of infection versus effusion EXAM: RIGHT HIP ASPIRATION UNDER FLUOROSCOPY FLUOROSCOPY TIME:  Fluoroscopy Time:  1 minutes Radiation Exposure Index (if provided by the fluoroscopic device): Number of Acquired Spot Images: 0 PROCEDURE: Overlying skin prepped with Betadine, draped in the usual sterile fashion, and infiltrated locally with buffered Lidocaine. Curved 22 gauge spinal needle advanced to the superolateral margin of the right femoral head. 1 ml of Lidocaine injected easily. Diagnostic injection of iodinated contrast demonstrates intra-articular spread without intravascular component. There was immediate return of yellow fluid. Approximately 20 mL of cloudy yellow fluid was able to be aspirated from the right hip joint. IMPRESSION: Technically successful right hip aspiration under fluoroscopic guidance. Approximately 20 mL of yellow cloudy fluid was aspirated and sent for laboratory evaluation. Electronically Signed   By: Charlett NoseKevin  Dover M.D.   On: 02/10/2019 17:14    Pending Labs Unresulted Labs (From admission, onward)    Start     Ordered   02/10/19 2332  SARS Coronavirus 2 (CEPHEID - Performed in [facility] hospital lab), Hosp Order  (Asymptomatic Patients Labs)  Once,   STAT    Question:  Rule Out  Answer:  Yes   02/10/19 2331   02/10/19 2157  Culture, blood (routine x 2)  BLOOD CULTURE X 2,   STAT     02/10/19 2159   02/10/19 2157  Lactic acid, plasma  Now then every 2 hours,   STAT     02/10/19 2159          Vitals/Pain Today's Vitals   02/10/19 2202  BP: (!) 153/93  Pulse: 78  Resp: 20  Temp: 99.1 F (37.3 C)  TempSrc: Oral  SpO2: 100%  PainSc: 10-Worst pain ever    Isolation Precautions No active isolations  Medications Medications  cefTRIAXone (ROCEPHIN) 2 g in sodium chloride 0.9 % 100 mL IVPB (has no administration in time range)  vancomycin (VANCOCIN) 1,750 mg in sodium chloride 0.9 % 500 mL IVPB (has no  administration in time range)  sodium chloride 0.9 % bolus 1,000 mL (0 mLs Intravenous Stopped 02/10/19 2358)  HYDROmorphone (DILAUDID) injection 1 mg (1 mg Intravenous Given 02/10/19 2223)    Mobility walks

## 2019-02-11 NOTE — Progress Notes (Signed)
PROGRESS NOTE  Ronald Morales EYC:[id number] DOB: [birth year] DOA: 02/10/2019 PCP: Patient, No Pcp Per  Brief History   Ronald Morales is a 43 y.o. male with no significant medical history who presents to the ED for further evaluation of a right hip pain due to effusion.  Patient states he first developed right-sided hip/groin pain on the night of 02/03/2019 when he was stepping up into his jeep and had sudden onset of sharp pain in his right groin.  He had persistent symptoms however was able to continue to bear weight and walk on his own.  He was using NSAIDs without significant relief.  He presented to [facility] ED on 02/06/2019.  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  He was provided crutches and discharged with prescriptions for naproxen and methocarbamol.  Patient states he has had progressive worsening right-sided hip pain and has been unable to ambulate on his own power.  His range of motion has progressively worsened.  He was seen by orthopedics on 02/09/2019 at which time joint aspiration was reportedly unsuccessful in the office.  He reportedly had an MRI performed which showed a large right-sided hip effusion (records not available).  He was sent for IR guided right hip aspiration which was performed on 02/10/2019 with approximately 20 mL's of yellow cloudy fluid aspirated and subsequently sent to the ED.  Labs show 55,000 WBCs with 97% neutrophils, no crystals seen.  Gram stain showed abundant WBCs without organisms seen.  Body fluid culture, uric acid, protein, glucose were obtained and in process.  Patient denies any associated subjective fevers, chest pain, dyspnea, abdominal pain, dysuria, rash, or other affected joints.  He is sexually active and admits to not using protection.  He denies any history of STI. He denies any history of penile drainage, or erythema or tenderness of his scrotum.  He is receiving IV Rocephin and Vancomycin.  Orthopedic surgery was consulted and has evaluated the patient. He does not intend to take the patient for I&D today, but recommends continuing IV antibiotics until at least tomorrow when he re-evaluates the patient.  Consultants  . Orthopedic surgery  Procedures  . IR aspiration of right hip on 02/10/2019  Antibiotics   Anti-infectives (From admission, onward)   Start     Dose/Rate Route Frequency Ordered Stop   02/11/19 1200  cefTRIAXone (ROCEPHIN) 2 g in sodium chloride 0.9 % 100 mL IVPB     2 g 200 mL/hr over 30 Minutes Intravenous Every 24 hours 02/11/19 0040     02/11/19 1100  vancomycin (VANCOCIN) IVPB 1000 mg/200 mL premix     1,000 mg 200 mL/hr over 60 Minutes Intravenous Every 12 hours 02/11/19 0057     02/11/19 0000  vancomycin (VANCOCIN) 1,750 mg in sodium chloride 0.9 % 500 mL IVPB     1,750 mg 250 mL/hr over 120 Minutes Intravenous  Once 02/10/19 2349 02/11/19 0216   02/10/19 2345  cefTRIAXone (ROCEPHIN) 2 g in sodium chloride 0.9 % 100 mL IVPB     2 g 200 mL/hr over 30 Minutes Intravenous  Once 02/10/19 2339 02/11/19 0048    .   Subjective  The patient states that he is feeling better. No new complaints.  Objective   Vitals:  Vitals:   02/11/19 0156 02/11/19 0701  BP: (!) 156/90 (!) 147/84  Pulse: 66 67  Resp: 19 20  Temp: 98.2 F (36.8 C) 98.2 F (36.8 C)  SpO2: 100% 100%    Exam:  Constitutional:  . The patient is awake, alert, and oriented x 3. No acute distress. Respiratory:  . No increased work of breathing. . No wheezes, rales, or rhonchi. . No tactile fremitus. Cardiovascular:  . Regular rate and rhythm . No murmurs, ectopy, and gallups . No lateral PMI. No thrills. Abdomen:  . Abdomen is soft, non-tender, non-distended . No hernias, masses, or organomegaly . Normoactive bowel sounds. Musculoskeletal:  . No cyanosis, clubbing, or edema Skin:  . No rashes, lesions, ulcers . palpation of  skin: no induration or nodules Neurologic:  . CN 2-12 intact . Sensation all 4 extremities intact Psychiatric:  . Mental status o Mood, affect appropriate o Orientation to person, place, time  . judgment and insight appear intact    I have personally reviewed the following:   Today's Data  . Vitals, CBC, Bmp  Micro Data  . Body fluid culture, Blood cultures: No growth  Scheduled Meds: . amLODipine  5 mg Oral Daily   Continuous Infusions: . cefTRIAXone (ROCEPHIN)  IV    . vancomycin 1,000 mg (02/11/19 1324)    Active Problems:   Effusion of right hip   LOS: 1 day   A & P   Right hip effusion: S/p IR aspiration 02/10/19. Labs show 55,000 WBCs with 97% neutrophils. No crystals seen to suggest crystal arthropathy.  Gram stain showed abundant WBCs without organisms seen, however due to elevated WBCs will cover empirically with IV antibiotics pending further blood and body fluid culture data. GC/chlamydia studies are pending. Septic arthritis is doubted by orthopedic surgery, but ortho has recommended continuing IV Rocephin and IV Vancomycin. Monitor cultures and recheck CRP and ESR tomorrow. Continue pain control with Tylenol, OxyIR, and IV morphine as needed  DVT prophylaxis: SCDs Code Status: Full code, confirmed with patient Family Communication: None present on admission Disposition Plan: Pending clinical progress  Gloria Lambertson, DO [facility] Direct contact: see [email or web address]  7PM-7AM contact night coverage as above 02/11/2019, 1:35 PM  LOS: 1 day

## 2019-02-11 NOTE — Consult Note (Signed)
Reason for Consult:Right hip pain Referring Physician: Dr. Judie Bonus is an 43 y.o. male.  HPI: 43 yo male with no prior history of hip problems who developed right hip pain approximately one week ago when stepping out of his vehicle and feeling a sharp pain in his right hip.He had worsening pain and was seen in [place] ED on 7/21 with negative x-rays for acute injury. Subsequently seen at [facility] and had an MRI showing a large joint effusion and possible loose body. At no time during the entire course of this has he had a fever, chills or systemic symptoms. He was sent for a joint aspiration yesterday and approximately 15 mls of fluid was obtained. Gram stain negative for organisms but white count was elevated with a left shift. Pain worsened after the aspiration and he reported to the ED. Labs showed an elevated sed rate and c-reactive protein. He was placed on  IV antibiotics pending evaluation this morning He states the hip is feeling a lot better this morning. He is able to move it better and had significantly less pain bearing weight this morning getting up to go to the bathroom. He remains without fever, chills or systemic symptoms.  History reviewed. No pertinent past medical history.  Past Surgical History:  Procedure Laterality Date  . KIDNEY SURGERY      Family History  Problem Relation Age of Onset  . Hypertension Mother     Social History:  reports that he has been smoking cigarettes. He has never used smokeless tobacco. He reports current alcohol use. He reports current drug use. Drug: Marijuana.  Allergies: No Known Allergies  Medications: I have reviewed the patient's current medications.  Results for orders placed or performed during the hospital encounter of 02/10/19 (from the past 48 hour(s))  Sedimentation rate     Status: Abnormal   Collection Time: 02/10/19 10:08 PM  Result Value Ref Range   Sed Rate 56 (H) 0 - 16 mm/hr    Comment: Performed  at [facility], [place] [address], [place], [place] [address]  Comprehensive metabolic panel     Status: Abnormal   Collection Time: 02/10/19 10:14 PM  Result Value Ref Range   Sodium 137 135 - 145 mmol/L   Potassium 3.7 3.5 - 5.1 mmol/L   Chloride 99 98 - 111 mmol/L   CO2 26 22 - 32 mmol/L   Glucose, Bld 132 (H) 70 - 99 mg/dL   BUN 17 6 - 20 mg/dL   Creatinine, Ser 1.03 0.61 - 1.24 mg/dL   Calcium 9.6 8.9 - 10.3 mg/dL   Total Protein 8.5 (H) 6.5 - 8.1 g/dL   Albumin 4.5 3.5 - 5.0 g/dL   AST 25 15 - 41 U/L   ALT 24 0 - 44 U/L   Alkaline Phosphatase 75 38 - 126 U/L   Total Bilirubin 0.4 0.3 - 1.2 mg/dL   GFR calc non Af Amer >60 >60 mL/min   GFR calc Af Amer >60 >60 mL/min   Anion gap 12 5 - 15    Comment: Performed at [facility], [place] [address], [place], [place] [address]  CBC with Differential     Status: Abnormal   Collection Time: 02/10/19 10:14 PM  Result Value Ref Range   WBC 11.7 (H) 4.0 - 10.5 K/uL   RBC 4.87 4.22 - 5.81 MIL/uL   Hemoglobin 14.3 13.0 - 17.0 g/dL   HCT 43.7 39.0 - 52.0 %  MCV 89.7 80.0 - 100.0 fL   MCH 29.4 26.0 - 34.0 pg   MCHC 32.7 30.0 - 36.0 g/dL   RDW 16.113.0 09.611.5 - 04.515.5 %   Platelets 323 150 - 400 K/uL   nRBC 0.0 0.0 - 0.2 %   Neutrophils Relative % 77 %   Neutro Abs 9.0 (H) 1.7 - 7.7 K/uL   Lymphocytes Relative 14 %   Lymphs Abs 1.6 0.7 - 4.0 K/uL   Monocytes Relative 8 %   Monocytes Absolute 0.9 0.1 - 1.0 K/uL   Eosinophils Relative 1 %   Eosinophils Absolute 0.1 0.0 - 0.5 K/uL   Basophils Relative 0 %   Basophils Absolute 0.0 0.0 - 0.1 K/uL   Immature Granulocytes 0 %   Abs Immature Granulocytes 0.03 0.00 - 0.07 K/uL    Comment: Performed at [facility], [address] [place], [place] [address]  Lactic acid, plasma     Status: None   Collection Time: 02/10/19 10:14 PM  Result Value Ref Range   Lactic Acid, Venous 1.5 0.5 - 1.9 mmol/L    Comment: Performed at [facility], [address] [place], [place] [address]  C-reactive protein     Status: Abnormal   Collection Time: 02/10/19 10:14 PM  Result Value Ref Range   CRP 13.5 (H) <1.0 mg/dL    Comment: Performed at [facility], [address] [place], [place] [address]  SARS Coronavirus 2 (CEPHEID - Performed in [facility] hospital lab), Hosp Order     Status: None   Collection Time: 02/10/19 11:40 PM   Specimen: Nasopharyngeal Swab  Result Value Ref Range   SARS Coronavirus 2 NEGATIVE NEGATIVE    Comment: (NOTE) If result is NEGATIVE SARS-CoV-2 target nucleic acids are NOT DETECTED. The SARS-CoV-2 RNA is generally detectable in upper and lower  respiratory specimens during the acute phase of infection. The lowest  concentration of SARS-CoV-2 viral copies this assay can detect is 250  copies / mL. A negative result does not preclude SARS-CoV-2 infection  and should not be used as the sole basis for treatment or other  patient management decisions.  A negative result may occur with  improper specimen collection / handling, submission of specimen other  than nasopharyngeal swab, presence of viral mutation(s) within the  areas targeted by this assay, and inadequate number of viral copies  (<250 copies / mL). A negative result must be combined with clinical  observations, patient history, and epidemiological information. If result is POSITIVE SARS-CoV-2 target nucleic acids are DETECTED. The SARS-CoV-2 RNA is generally detectable in upper and lower  respiratory specimens dur ing the acute phase of infection.  Positive  results are indicative of active infection with SARS-CoV-2.  Clinical  correlation with patient history and other diagnostic information is  necessary to determine patient infection status.  Positive results do  not rule out bacterial infection or co-infection with other viruses. If result is PRESUMPTIVE POSTIVE SARS-CoV-2 nucleic acids MAY BE  PRESENT.   A presumptive positive result was obtained on the submitted specimen  and confirmed on repeat testing.  While 2019 novel coronavirus  (SARS-CoV-2) nucleic acids may be present in the submitted sample  additional confirmatory testing may be necessary for epidemiological  and / or clinical management purposes  to differentiate between  SARS-CoV-2 and other Sarbecovirus currently known to infect humans.  If clinically indicated additional testing with an alternate test  methodology [id number]) is advised. The  SARS-CoV-2 RNA is generally  detectable in upper and lower respiratory sp ecimens during the acute  phase of infection. The expected result is Negative. Fact Sheet for Patients:  BoilerBrush.com.cyhttps://www.fda.gov/media/136312/download Fact Sheet for Healthcare Providers: https://pope.com/https://www.fda.gov/media/136313/download This test is not yet approved or cleared by the [place] FDA and has been authorized for detection and/or diagnosis of SARS-CoV-2 by FDA under an Emergency Use Authorization (EUA).  This EUA will remain in effect (meaning this test can be used) for the duration of the COVID-19 declaration under Section 564(b)(1) of the Act, 21 U.S.C. section 360bbb-3(b)(1), unless the authorization is terminated or revoked sooner. Performed at [facility], [address] [place], [place] [address]   Lactic acid, plasma     Status: None   Collection Time: 02/11/19  2:45 AM  Result Value Ref Range   Lactic Acid, Venous 0.9 0.5 - 1.9 mmol/L    Comment: Performed at [facility], [address] [place], [place] [address]  CBC     Status: Abnormal   Collection Time: 02/11/19  2:45 AM  Result Value Ref Range   WBC 10.5 4.0 - 10.5 K/uL   RBC 4.29 4.22 - 5.81 MIL/uL   Hemoglobin 12.7 (L) 13.0 - 17.0 g/dL   HCT 41.339.4 24.439.0 - 01.052.0 %   MCV 91.8 80.0 - 100.0 fL   MCH 29.6 26.0 - 34.0 pg   MCHC 32.2 30.0 - 36.0 g/dL   RDW 27.213.2 53.611.5 - 64.415.5 %   Platelets 275  150 - 400 K/uL   nRBC 0.0 0.0 - 0.2 %    Comment: Performed at [facility], [address] [place], [place] [address]  Basic metabolic panel     Status: Abnormal   Collection Time: 02/11/19  2:45 AM  Result Value Ref Range   Sodium 138 135 - 145 mmol/L   Potassium 4.2 3.5 - 5.1 mmol/L   Chloride 105 98 - 111 mmol/L   CO2 24 22 - 32 mmol/L   Glucose, Bld 113 (H) 70 - 99 mg/dL   BUN 15 6 - 20 mg/dL   Creatinine, Ser 2.590.91 0.61 - 1.24 mg/dL   Calcium 8.6 (L) 8.9 - 10.3 mg/dL   GFR calc non Af Amer >60 >60 mL/min   GFR calc Af Amer >60 >60 mL/min   Anion gap 9 5 - 15    Comment: Performed at [facility], [address] [place], [place] [address]    Dg Fluoro Guided Needle Plc Aspiration/injection Loc  Result Date: 02/10/2019 CLINICAL DATA:  Right hip pain, question of infection versus effusion EXAM: RIGHT HIP ASPIRATION UNDER FLUOROSCOPY FLUOROSCOPY TIME:  Fluoroscopy Time:  1 minutes Radiation Exposure Index (if provided by the fluoroscopic device): Number of Acquired Spot Images: 0 PROCEDURE: Overlying skin prepped with Betadine, draped in the usual sterile fashion, and infiltrated locally with buffered Lidocaine. Curved 22 gauge spinal needle advanced to the superolateral margin of the right femoral head. 1 ml of Lidocaine injected easily. Diagnostic injection of iodinated contrast demonstrates intra-articular spread without intravascular component. There was immediate return of yellow fluid. Approximately 20 mL of cloudy yellow fluid was able to be aspirated from the right hip joint. IMPRESSION: Technically successful right hip aspiration under fluoroscopic guidance. Approximately 20 mL of yellow cloudy fluid was aspirated and sent for laboratory evaluation. Electronically Signed   By: Charlett NoseKevin  Dover M.D.   On: 02/10/2019 17:14    ROS Blood pressure (!) 156/90, pulse 66,  temperature 98.2 F (36.8 C), temperature source Oral, resp. rate 19, height 5'  11" (1.803 m), weight 77.4 kg, SpO2 100 %. Physical Exam  WD, WN male alert and oriented in NAD He is now able to extend his hip while lying in bed (was holding in flexed position all night due to pain) I can flex his hip to 100, Internally rotate 20, externally rotate 30 and abduct 30 with slight discomfort (which he says is much less than yesterday) He was able to ambulate to the bathroom with crutches with mild subjective discomfort, which he states is also a big improvement from yesterday  Assessment/Plan: Right hip pain- He has a very confusing presentation with this hip pain. He had a distinct mechanical episode where it started and MRI showed effusion with loose body. All of that points towards an acute chondral injury with reactive effusion. The confusing aspect was the marked increase in pain yesterday after the aspiration and then the inflammatory aspirate and elevated inflammatory markers. All of that suggested a septic joint but he truly had no reason to spontaneously develop a septic joint as he is not immunocompromised and has no other systemic symptoms. He denies any urinary symptoms or penile discharge thus monoarticular septic arthritis with N. Gonorrhea is highly unlikely. I was strongly considering a hip irrigation and debridement upon reviewing the data but upon examination him today he reports significant improvement thus I feel that another day of antibiotics and rest is warranted. Repeat sed rate and crp tomorrow and if worse then consider surgery but if stabilized or improved then it argues against septic joint. Also if symptoms continue to improve that would be best indication to hold off on surgery. He may eat today and one of my partners will check on him tomorrow to see if his condition continues to improve or whether it warrants surgical intervention  Ollen GrossFrank Dayle Sherpa 02/11/2019, 6:50 AM

## 2019-02-11 NOTE — Progress Notes (Signed)
Report given to [place]; Patient transported to unit via wheelchair accompanied by RN

## 2019-02-11 NOTE — ED Notes (Signed)
Admitting MD at bedside.

## 2019-02-11 NOTE — Progress Notes (Signed)
Pharmacy Antibiotic Note  Abraham Entwistle is a 43 y.o. male admitted on 02/10/2019 with hip effusion, possible septic arthritis.  Pharmacy has been consulted for vancomcyin dosing.  Plan: Vancomycin 1750 mg x1 then 1 Gm IV q12h for est AUC = 517 Goal AUC = 400-550 F/u scr/cultures/levels Rocephin 2 Gm IV q24h  Temp (24hrs), Avg:99.1 F (37.3 C), Min:99.1 F (37.3 C), Max:99.1 F (37.3 C)  Recent Labs  Lab 02/10/19 2214  WBC 11.7*  CREATININE 1.03  LATICACIDVEN 1.5    Estimated Creatinine Clearance: 100.9 mL/min (by C-G formula based on SCr of 1.03 mg/dL).    No Known Allergies  Antimicrobials this admission: 7/24 rocephin >>  7/24 vancomycin >>   Dose adjustments this admission:   Microbiology results:  BCx:   UCx:    Sputum:    MRSA PCR:   Thank you for allowing pharmacy to be a part of this patient's care.  Dorrene German 02/11/2019 12:52 AM

## 2019-02-12 LAB — CBC
HCT: 40.3 % (ref 39.0–52.0)
Hemoglobin: 13.3 g/dL (ref 13.0–17.0)
MCH: 29.8 pg (ref 26.0–34.0)
MCHC: 33 g/dL (ref 30.0–36.0)
MCV: 90.4 fL (ref 80.0–100.0)
Platelets: 300 10*3/uL (ref 150–400)
RBC: 4.46 MIL/uL (ref 4.22–5.81)
RDW: 12.9 % (ref 11.5–15.5)
WBC: 7.6 10*3/uL (ref 4.0–10.5)
nRBC: 0 % (ref 0.0–0.2)

## 2019-02-12 LAB — URIC ACID, BODY FLUID: Uric Acid Body Fluid: 3.4 mg/dL

## 2019-02-12 LAB — GLUCOSE, BODY FLUID OTHER: Glucose, Body Fluid Other: 13 mg/dL

## 2019-02-12 LAB — PROTEIN, BODY FLUID (OTHER): Total Protein, Body Fluid Other: 5 g/dL

## 2019-02-12 LAB — SEDIMENTATION RATE: Sed Rate: 65 mm/hr — ABNORMAL HIGH (ref 0–16)

## 2019-02-12 LAB — C-REACTIVE PROTEIN: CRP: 16.4 mg/dL — ABNORMAL HIGH (ref <1.0)

## 2019-02-12 NOTE — Progress Notes (Addendum)
S: Patient seen and examined. States R hip pain is improving.  O: AF VSS  Patient ambulating upon my entry into his room NAD R hip: No erythema or swelling. Painless logrolling of hip. NVI distally.  WBC 7.6 <-- 10.5 <-- 11.7 ESR stable at 65 mm/hr <-- 56 mm/hr Synovial WBC 55,000 with 97% Neutrophils. Gram stain (-). Culture NGTD. CRP 16.4 <-- 13.5 GC pending   Recent Results (from the past 240 hour(s))  Body fluid culture     Status: None (Preliminary result)   Collection Time: 02/10/19  5:04 PM   Specimen: Joint, Right Hip; Synovial Fluid  Result Value Ref Range Status   Specimen Description SYNOVIAL RIGHT HIP  Final   Special Requests NONE  Final   Gram Stain   Final    ABUNDANT WBC PRESENT, PREDOMINANTLY PMN NO ORGANISMS SEEN    Culture   Final    NO GROWTH 2 DAYS Performed at [facility], [address] [place], [place] [address]    Report Status PENDING  Incomplete  Culture, blood (routine x 2)     Status: None (Preliminary result)   Collection Time: 02/10/19 10:02 PM   Specimen: BLOOD  Result Value Ref Range Status   Specimen Description   Final    BLOOD RIGHT ANTECUBITAL Performed at [facility] [address], [place], [place] [address]    Special Requests   Final    BOTTLES DRAWN AEROBIC AND ANAEROBIC Blood Culture results may not be optimal due to an excessive volume of blood received in culture bottles Performed at [facility] [address] [place], [place] [address]    Culture   Final    NO GROWTH 2 DAYS Performed at [facility] [address] [place], [place] [address]    Report Status PENDING  Incomplete  Culture, blood (routine x 2)     Status: None (Preliminary result)   Collection Time: 02/10/19 10:17 PM   Specimen: BLOOD  Result Value Ref Range Status   Specimen Description   Final    BLOOD LEFT ANTECUBITAL Performed at [facility] [address]  [place], [place] [address]    Special Requests   Final    BOTTLES DRAWN AEROBIC AND ANAEROBIC Blood Culture results may not be optimal due to an excessive volume of blood received in culture bottles Performed at [facility] [address] [place], [place] [address]    Culture   Final    NO GROWTH 2 DAYS Performed at [facility] [address], [place], [place] [address]    Report Status PENDING  Incomplete  SARS Coronavirus 2 (CEPHEID - Performed in [place] hospital lab), Hosp Order     Status: None   Collection Time: 02/10/19 11:40 PM   Specimen: Nasopharyngeal Swab  Result Value Ref Range Status   SARS Coronavirus 2 NEGATIVE NEGATIVE Final    Comment: (NOTE) If result is NEGATIVE SARS-CoV-2 target nucleic acids are NOT DETECTED. The SARS-CoV-2 RNA is generally detectable in upper and lower  respiratory specimens during the acute phase of infection. The lowest  concentration of SARS-CoV-2 viral copies this assay can detect is 250  copies / mL. A negative result does not preclude SARS-CoV-2 infection  and should not be used as the sole basis for treatment or other  patient management decisions.  A negative result may occur with  improper specimen collection / handling, submission of  specimen other  than nasopharyngeal swab, presence of viral mutation(s) within the  areas targeted by this assay, and inadequate number of viral copies  (<250 copies / mL). A negative result must be combined with clinical  observations, patient history, and epidemiological information. If result is POSITIVE SARS-CoV-2 target nucleic acids are DETECTED. The SARS-CoV-2 RNA is generally detectable in upper and lower  respiratory specimens dur ing the acute phase of infection.  Positive  results are indicative of active infection with SARS-CoV-2.  Clinical  correlation with patient history and other diagnostic information is  necessary to determine patient infection status.   Positive results do  not rule out bacterial infection or co-infection with other viruses. If result is PRESUMPTIVE POSTIVE SARS-CoV-2 nucleic acids MAY BE PRESENT.   A presumptive positive result was obtained on the submitted specimen  and confirmed on repeat testing.  While 2019 novel coronavirus  (SARS-CoV-2) nucleic acids may be present in the submitted sample  additional confirmatory testing may be necessary for epidemiological  and / or clinical management purposes  to differentiate between  SARS-CoV-2 and other Sarbecovirus currently known to infect humans.  If clinically indicated additional testing with an alternate test  methodology [phone]) is advised. The SARS-CoV-2 RNA is generally  detectable in upper and lower respiratory sp ecimens during the acute  phase of infection. The expected result is Negative. Fact Sheet for Patients:  StrictlyIdeas.no Fact Sheet for Healthcare Providers: BankingDealers.co.za This test is not yet approved or cleared by the [place] FDA and has been authorized for detection and/or diagnosis of SARS-CoV-2 by FDA under an Emergency Use Authorization (EUA).  This EUA will remain in effect (meaning this test can be used) for the duration of the COVID-19 declaration under Section 564(b)(1) of the Act, 21 U.S.C. section 360bbb-3(b)(1), unless the authorization is terminated or revoked sooner. Performed at [facility], [place] [address] [place], [place] [address]      A/P: R hip effusion with recent injury. Scenario most consistent with acute traumatic synovitis.  Pre hospital MRI showed effusion and loose body. The confusing piece is his elevated inflammatory markers. Differential includes gonococcal arthritis and less likely, rheumatological processes.  Septic arthritis due to other bacteria seems extremely unlikely due his improved pain and mobility.  Gonococcal arthritis is  treated with abx; I&D not indicated. He will most likely need nonurgent hip arthroscopy for loose body removal in the future. Today, there is no indication for emergent I&D.  -Activity as tolerated -Follow R hip aspirate culture -Dr. Wynelle Link to reexamine tomorrow -NPO after MN   02/12/19 8:56 AM Bertram Savin, MD

## 2019-02-12 NOTE — Progress Notes (Signed)
PROGRESS NOTE  Ronald Morales CXK:[id number] DOB: [birth year] DOA: 02/10/2019 PCP: Patient, No Pcp Per  Brief History   Ronald Morales is a 43 y.o. male with no significant medical history who presents to the ED for further evaluation of a right hip pain due to effusion.  Patient states he first developed right-sided hip/groin pain on the night of 02/03/2019 when he was stepping up into his jeep and had sudden onset of sharp pain in his right groin.  He had persistent symptoms however was able to continue to bear weight and walk on his own.  He was using NSAIDs without significant relief.  He presented to [place] ED on 02/06/2019.  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  He was provided crutches and discharged with prescriptions for naproxen and methocarbamol.  Patient states he has had progressive worsening right-sided hip pain and has been unable to ambulate on his own power.  His range of motion has progressively worsened.  He was seen by orthopedics on 02/09/2019 at which time joint aspiration was reportedly unsuccessful in the office.  He reportedly had an MRI performed which showed a large right-sided hip effusion (records not available).  He was sent for IR guided right hip aspiration which was performed on 02/10/2019 with approximately 20 mL's of yellow cloudy fluid aspirated and subsequently sent to the ED.  Labs show 55,000 WBCs with 97% neutrophils, no crystals seen.  Gram stain showed abundant WBCs without organisms seen.  Body fluid culture, uric acid, protein, glucose were obtained and in process.  Patient denies any associated subjective fevers, chest pain, dyspnea, abdominal pain, dysuria, rash, or other affected joints.  He is sexually active and admits to not using protection.  He denies any history of STI. He denies any history of penile drainage, or erythema or tenderness of his scrotum.  He is receiving IV Rocephin and Vancomycin.  Orthopedic surgery was consulted and has evaluated the patient. He does not intend to take the patient for I&D today, but recommends continuing IV antibiotics until at least tomorrow when he re-evaluates the patient.  Consultants  . Orthopedic surgery  Procedures  . IR aspiration of right hip on 02/10/2019  Antibiotics   Anti-infectives (From admission, onward)   Start     Dose/Rate Route Frequency Ordered Stop   02/11/19 1200  cefTRIAXone (ROCEPHIN) 2 g in sodium chloride 0.9 % 100 mL IVPB     2 g 200 mL/hr over 30 Minutes Intravenous Every 24 hours 02/11/19 0040     02/11/19 1100  vancomycin (VANCOCIN) IVPB 1000 mg/200 mL premix     1,000 mg 200 mL/hr over 60 Minutes Intravenous Every 12 hours 02/11/19 0057     02/11/19 0000  vancomycin (VANCOCIN) 1,750 mg in sodium chloride 0.9 % 500 mL IVPB     1,750 mg 250 mL/hr over 120 Minutes Intravenous  Once 02/10/19 2349 02/11/19 0216   02/10/19 2345  cefTRIAXone (ROCEPHIN) 2 g in sodium chloride 0.9 % 100 mL IVPB     2 g 200 mL/hr over 30 Minutes Intravenous  Once 02/10/19 2339 02/11/19 0048      Subjective  The patient states that he is still having pain in his left hip. No new complaints.  Objective   Vitals:  Vitals:   02/12/19 0521 02/12/19 1031  BP: (!) 162/83 (!) 147/77  Pulse: 72   Resp: 18   Temp: 99.1 F (37.3 C)  SpO2: 100%     Exam:  Constitutional:  . The patient is awake, alert, and oriented x 3. No acute distress. Respiratory:  . No increased work of breathing. . No wheezes, rales, or rhonchi. . No tactile fremitus. Cardiovascular:  . Regular rate and rhythm . No murmurs, ectopy, and gallups . No lateral PMI. No thrills. Abdomen:  . Abdomen is soft, non-tender, non-distended . No hernias, masses, or organomegaly . Normoactive bowel sounds. Musculoskeletal:  . No cyanosis, clubbing, or edema Skin:  . No rashes, lesions, ulcers . palpation of skin: no  induration or nodules Neurologic:  . CN 2-12 intact . Sensation all 4 extremities intact Psychiatric:  . Mental status o Mood, affect appropriate o Orientation to person, place, time  . judgment and insight appear intact    I have personally reviewed the following:   Today's Data  . Vitals, CBC  Micro Data  . Body fluid culture, Blood cultures: No growth  Scheduled Meds: . amLODipine  5 mg Oral Daily   Continuous Infusions: . cefTRIAXone (ROCEPHIN)  IV 2 g (02/12/19 1207)  . vancomycin 1,000 mg (02/12/19 1037)    Active Problems:   Effusion of right hip   LOS: 2 days   A & P   Right hip effusion: S/p IR aspiration 02/10/19. Labs show 55,000 WBCs with 97% neutrophils. No crystals seen to suggest crystal arthropathy.  Gram stain showed abundant WBCs without organisms seen, however due to elevated WBCs will cover empirically with IV antibiotics pending further blood and body fluid culture data. GC/chlamydia studies are pending. Septic arthritis is doubted by orthopedic surgery, but ortho has recommended continuing IV Rocephin and IV Vancomycin. Monitor cultures and recheck CRP and ESR tomorrow. Continue pain control with Tylenol, OxyIR, and IV morphine as needed  DVT prophylaxis: SCDs Code Status: Full code, confirmed with patient Family Communication: None present on admission Disposition Plan: Pending clinical progress  Cameka Rae, DO [facility] Direct contact: see [email or web address]  7PM-7AM contact night coverage as above 02/12/2019, 12:58 PM  LOS: 1 day

## 2019-02-13 DIAGNOSIS — I10 Essential (primary) hypertension: Secondary | ICD-10-CM

## 2019-02-13 DIAGNOSIS — R262 Difficulty in walking, not elsewhere classified: Secondary | ICD-10-CM

## 2019-02-13 MED ORDER — AMLODIPINE BESYLATE 5 MG PO TABS: 5.0000 mg | ORAL_TABLET | Freq: Every day | ORAL | 0 refills | Status: DC

## 2019-02-13 MED ORDER — CEPHALEXIN 500 MG PO CAPS: 500.0000 mg | ORAL_CAPSULE | Freq: Four times a day (QID) | ORAL | 0 refills | Status: AC

## 2019-02-13 MED ORDER — OXYCODONE HCL 5 MG PO TABS: 5.0000 mg | ORAL_TABLET | ORAL | 0 refills | Status: DC | PRN

## 2019-02-13 MED ORDER — NAPROXEN 500 MG PO TABS: 500.0000 mg | ORAL_TABLET | Freq: Two times a day (BID) | ORAL | 0 refills | Status: DC

## 2019-02-13 NOTE — Discharge Summary (Signed)
Physician Discharge Summary  Ronald Morales [id number] DOB: October 25, 1975 DOA: 02/10/2019  PCP: Patient, No Pcp Per  Admit date: 02/10/2019 Discharge date: 02/13/2019  Recommendations for Outpatient Follow-up:  1. Follow up with orthopedic surgery as directed. 2. No driving until cleared by orthopedic surgery 3. Follow up with PCP in 7-10 days  Follow-up Information    Yolonda Kidaogers, Jason Patrick, MD. Schedule an appointment as soon as possible for a visit in 2 days.   Specialty: Orthopedic Surgery Why: Call [phone] today to get an appointment with Dr. Duwayne HeckJason Rogers this week Contact information: [address] [place] [address] [phone]            Discharge Diagnoses: Principal diagnosis is #1 1. Right sided hip pain/right hip effusion 2. Hypertension 3. Ambulatory dysfunction  Discharge Condition: Fair Disposition: Home  Diet recommendation: Regular  Filed Weights   02/11/19 0101  Weight: 77.4 kg    History of present illness:  Ronald Palauntonieo Orsak is a 43 y.o. male with no significant medical history who presents to the ED for further evaluation of a right hip pain due to effusion.  Patient states he first developed right-sided hip/groin pain on the night of 02/03/2019 when he was stepping up into his jeep and had sudden onset of sharp pain in his right groin.  He had persistent symptoms however was able to continue to bear weight and walk on his own.  He was using NSAIDs without significant relief.  [facility].  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  He was provided crutches and discharged with prescriptions for naproxen and methocarbamol.  Patient states he has had progressive worsening right-sided hip pain and has been unable to ambulate on his own power.  His range of motion has progressively worsened.  He was seen by orthopedics  on 02/09/2019 at which time joint aspiration was reportedly unsuccessful in the office.  He reportedly had an MRI performed which showed a large right-sided hip effusion (records not available).  He was sent for IR guided right hip aspiration which was performed on 02/10/2019 with approximately 20 mL's of yellow cloudy fluid aspirated and subsequently sent to the ED.  Labs show 55,000 WBCs with 97% neutrophils, no crystals seen.  Gram stain showed abundant WBCs without organisms seen.  Body fluid culture, uric acid, protein, glucose were obtained and in process.  Patient denies any associated subjective fevers, chest pain, dyspnea, abdominal pain, dysuria, rash, or other affected joints.  He is sexually active and admits to not using protection.  He denies any history of STI.  Hospital Course:   Orthopedic surgery was consulted and has evaluated the patient. The patient has had 3 days of IV Vancomycin and Rocephin. He is feeling quite a bit better today. Orthopedic surgery has cleared the patient for discharge. They will follow him as outpatient for arthrocentesis.  Today's assessment: S: The patient is resting comfortably. No new complaints. O: Vitals:  Vitals:   02/12/19 2048 02/13/19 0620  BP: (!) 150/87 129/84  Pulse: 76 62  Resp: 18 18  Temp: 98.6 F (37 C) 98.2 F (36.8 C)  SpO2: 100% 95%   Constitutional:   The patient is awake, alert, and oriented x 3. No acute distress. Respiratory:   No increased work of breathing.  No wheezes, rales, or rhonchi.  No tactile fremitus. Cardiovascular:   Regular rate and rhythm  No murmurs, ectopy, and gallups  No lateral PMI. No thrills. Abdomen:   Abdomen is soft, non-tender, non-distended  No hernias, masses, or organomegaly  Normoactive bowel sounds. Musculoskeletal:   No cyanosis, clubbing, or edema Skin:   No rashes, lesions, ulcers  palpation of skin: no induration or nodules Neurologic:   CN 2-12 intact   Sensation all 4 extremities intact Psychiatric:   Mental status ? Mood, affect appropriate ? Orientation to person, place, time   judgment and insight appear intact     Discharge Instructions  Discharge Instructions    Activity as tolerated - No restrictions   Complete by: As directed    Call MD for:  redness, tenderness, or signs of infection (pain, swelling, redness, odor or green/yellow discharge around incision site)   Complete by: As directed    Call MD for:  severe uncontrolled pain   Complete by: As directed    Diet - low sodium heart healthy   Complete by: As directed    Discharge instructions   Complete by: As directed    No driving until cleared by orthopedic surgery. Follow up with orthopedic surgery as directed. Follow up with PCP in 7-10 days.   Increase activity slowly   Complete by: As directed      Allergies as of 02/13/2019   No Known Allergies     Medication List    STOP taking these medications   HYDROcodone-acetaminophen 5-325 MG tablet Commonly known as: NORCO/VICODIN   ibuprofen 200 MG tablet Commonly known as: ADVIL   Ibuprofen PM 200-25 MG Caps Generic drug: Ibuprofen-diphenhydrAMINE HCl     TAKE these medications   acetaminophen 325 MG tablet Commonly known as: TYLENOL Take 650 mg by mouth every 6 (six) hours as needed for mild pain.   amLODipine 5 MG tablet Commonly known as: NORVASC Take 1 tablet (5 mg total) by mouth daily. Start taking on: February 14, 2019   cephALEXin 500 MG capsule Commonly known as: KEFLEX Take 1 capsule (500 mg total) by mouth 4 (four) times daily for 10 days.   methocarbamol 500 MG tablet Commonly known as: ROBAXIN Take 2 tablets (1,000 mg total) by mouth 2 (two) times daily for 10 days. What changed:   when to take this  reasons to take this   naproxen 500 MG tablet Commonly known as: NAPROSYN Take 1 tablet (500 mg total) by mouth 2 (two) times daily.   oxyCODONE 5 MG immediate release tablet  Commonly known as: Oxy IR/ROXICODONE Take 1 tablet (5 mg total) by mouth every 4 (four) hours as needed for moderate pain or breakthrough pain (Hold & Call MD if SBP<90, HR<65, RR<10, O2<90, or altered mental status.).      No Known Allergies  The results of significant diagnostics from this hospitalization (including imaging, microbiology, ancillary and laboratory) are listed below for reference.    Significant Diagnostic Studies: Dg Fluoro Guided Needle Plc Aspiration/injection Loc  Result Date: 02/10/2019 CLINICAL DATA:  Right hip pain, question of infection versus effusion EXAM: RIGHT HIP ASPIRATION UNDER FLUOROSCOPY FLUOROSCOPY TIME:  Fluoroscopy Time:  1 minutes Radiation Exposure Index (if provided by the fluoroscopic device): Number of Acquired Spot Images: 0 PROCEDURE: Overlying skin prepped with Betadine, draped in the usual sterile fashion, and infiltrated locally with buffered Lidocaine. Curved 22 gauge spinal needle advanced to the superolateral margin of the right femoral head. 1 ml of Lidocaine injected easily. Diagnostic injection of iodinated contrast demonstrates intra-articular spread without intravascular component. There was  immediate return of yellow fluid. Approximately 20 mL of cloudy yellow fluid was able to be aspirated from the right hip joint. IMPRESSION: Technically successful right hip aspiration under fluoroscopic guidance. Approximately 20 mL of yellow cloudy fluid was aspirated and sent for laboratory evaluation. Electronically Signed   By: Charlett NoseKevin  Dover M.D.   On: 02/10/2019 17:14   Dg Hip Unilat With Pelvis 2-3 Views Right  Result Date: 02/06/2019 CLINICAL DATA:  Right hip pain since Friday. EXAM: DG HIP (WITH OR WITHOUT PELVIS) 2-3V RIGHT COMPARISON:  None. FINDINGS: Both hips are normally located. Minimal degenerative changes bilaterally. Mild hip dysplasia bilaterally with mild coxa magna deformity with prominent and shortened femoral neck is. This could  predispose to femoroacetabular impingement. No acute bony findings or plain film evidence of AVN. The pubic symphysis and SI joints are intact. No pelvic fractures or bone lesions. IMPRESSION: 1. 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 This could predispose to femoroacetabular impingement. 2. No acute bony findings. Electronically Signed   By: Rudie MeyerP.  Gallerani M.D.   On: 02/06/2019 15:45    Microbiology: Recent Results (from the past 240 hour(s))  Body fluid culture     Status: None (Preliminary result)   Collection Time: 02/10/19  5:04 PM   Specimen: Joint, Right Hip; Synovial Fluid  Result Value Ref Range Status   Specimen Description SYNOVIAL RIGHT HIP  Final   Special Requests NONE  Final   Gram Stain   Final    ABUNDANT WBC PRESENT, PREDOMINANTLY PMN NO ORGANISMS SEEN    Culture   Final    NO GROWTH 3 DAYS Performed at [facility], [address] [place], [place] [address]    Report Status PENDING  Incomplete  Culture, blood (routine x 2)     Status: None (Preliminary result)   Collection Time: 02/10/19 10:02 PM   Specimen: BLOOD  Result Value Ref Range Status   Specimen Description   Final    BLOOD RIGHT ANTECUBITAL Performed at [facility], [address] [place], [place] [address]    Special Requests   Final    BOTTLES DRAWN AEROBIC AND ANAEROBIC Blood Culture results may not be optimal due to an excessive volume of blood received in culture bottles Performed at [facility], [address] [place], [place] [address]    Culture   Final    NO GROWTH 3 DAYS Performed at [facility], [address] [place], [place] [address]    Report Status PENDING  Incomplete  Culture, blood (routine x 2)     Status: None (Preliminary result)   Collection Time: 02/10/19 10:17 PM   Specimen: BLOOD  Result Value Ref Range Status   Specimen Description   Final    BLOOD LEFT ANTECUBITAL Performed at [facility], [address] [place], [place] [address]    Special Requests   Final    BOTTLES DRAWN AEROBIC AND ANAEROBIC Blood Culture results may not be optimal due to an excessive volume of blood received in culture bottles Performed at [facility], [address] [place], [place] [address]    Culture   Final    NO GROWTH 3 DAYS Performed at [facility], [address] [place], [place] [address]    Report Status PENDING  Incomplete  SARS Coronavirus 2 (CEPHEID - Performed in [facility] hospital lab), Hosp Order     Status: None  Collection Time: 02/10/19 11:40 PM   Specimen: Nasopharyngeal Swab  Result Value Ref Range Status   SARS Coronavirus 2 NEGATIVE NEGATIVE Final    Comment: (NOTE) If result is NEGATIVE SARS-CoV-2 target nucleic acids are NOT DETECTED. The SARS-CoV-2 RNA is generally detectable in upper and lower  respiratory specimens during the acute phase of infection. The lowest  concentration of SARS-CoV-2 viral copies this assay can detect is 250  copies / mL. A negative result does not preclude SARS-CoV-2 infection  and should not be used as the sole basis for treatment or other  patient management decisions.  A negative result may occur with  improper specimen collection / handling, submission of specimen other  than nasopharyngeal swab, presence of viral mutation(s) within the  areas targeted by this assay, and inadequate number of viral copies  (<250 copies / mL). A negative result must be combined with clinical  observations, patient history, and epidemiological information. If result is POSITIVE SARS-CoV-2 target nucleic acids are DETECTED. The SARS-CoV-2 RNA is generally detectable in upper and lower  respiratory specimens dur ing the acute phase of infection.  Positive  results are indicative of active infection with SARS-CoV-2.  Clinical  correlation with patient history and other diagnostic information is   necessary to determine patient infection status.  Positive results do  not rule out bacterial infection or co-infection with other viruses. If result is PRESUMPTIVE POSTIVE SARS-CoV-2 nucleic acids MAY BE PRESENT.   A presumptive positive result was obtained on the submitted specimen  and confirmed on repeat testing.  While 2019 novel coronavirus  (SARS-CoV-2) nucleic acids may be present in the submitted sample  additional confirmatory testing may be necessary for epidemiological  and / or clinical management purposes  to differentiate between  SARS-CoV-2 and other Sarbecovirus currently known to infect humans.  If clinically indicated additional testing with an alternate test  methodology [phone]) is advised. The SARS-CoV-2 RNA is generally  detectable in upper and lower respiratory sp ecimens during the acute  phase of infection. The expected result is Negative. Fact Sheet for Patients:  BoilerBrush.com.cy Fact Sheet for Healthcare Providers: https://pope.com/ This test is not yet approved or cleared by the [place] FDA and has been authorized for detection and/or diagnosis of SARS-CoV-2 by FDA under an Emergency Use Authorization (EUA).  This EUA will remain in effect (meaning this test can be used) for the duration of the COVID-19 declaration under Section 564(b)(1) of the Act, 21 U.S.C. section 360bbb-3(b)(1), unless the authorization is terminated or revoked sooner. Performed at [facility], [address], [place], [place] [address]      Labs: Basic Metabolic Panel: Recent Labs  Lab 02/10/19 2214 02/11/19 0245  NA 137 138  K 3.7 4.2  CL 99 105  CO2 26 24  GLUCOSE 132* 113*  BUN 17 15  CREATININE 1.03 0.91  CALCIUM 9.6 8.6*   Liver Function Tests: Recent Labs  Lab 02/10/19 2214  AST 25  ALT 24  ALKPHOS 75  BILITOT 0.4  PROT 8.5*  ALBUMIN 4.5   No results for input(s): LIPASE,  AMYLASE in the last 168 hours. No results for input(s): AMMONIA in the last 168 hours. CBC: Recent Labs  Lab 02/10/19 2214 02/11/19 0245 02/12/19 0358  WBC 11.7* 10.5 7.6  NEUTROABS 9.0*  --   --   HGB 14.3 12.7* 13.3  HCT 43.7 39.4 40.3  MCV 89.7 91.8 90.4  PLT 323 275 300   Cardiac Enzymes: No  results for input(s): CKTOTAL, CKMB, CKMBINDEX, TROPONINI in the last 168 hours. BNP: BNP (last 3 results) No results for input(s): BNP in the last 8760 hours.  ProBNP (last 3 results) No results for input(s): PROBNP in the last 8760 hours.  CBG: No results for input(s): GLUCAP in the last 168 hours.  Active Problems:   Effusion of right hip  Time coordinating discharge: 38 minutes  Signed:        Brinkley Peet, DO [facility]  02/13/2019, 7:42 PM

## 2019-02-13 NOTE — Progress Notes (Signed)
Subjective: Pt still with hip pain but intensity has decreased   Objective: Vital signs in last 24 hours: Temp:  [98.2 F (36.8 C)-98.6 F (37 C)] 98.2 F (36.8 C) (07/27 0620) Pulse Rate:  [62-88] 62 (07/27 0620) Resp:  [18] 18 (07/27 0620) BP: (129-155)/(77-96) 129/84 (07/27 0620) SpO2:  [95 %-100 %] 95 % (07/27 0620)  Intake/Output from previous day: 07/26 0701 - 07/27 0700 In: 941 [P.O.:240; IV Piggyback:701] Out: 0  Intake/Output this shift: No intake/output data recorded.  Recent Labs    02/10/19 2214 02/11/19 0245 02/12/19 0358  HGB 14.3 12.7* 13.3   Recent Labs    02/11/19 0245 02/12/19 0358  WBC 10.5 7.6  RBC 4.29 4.46  HCT 39.4 40.3  PLT 275 300   Recent Labs    02/10/19 2214 02/11/19 0245  NA 137 138  K 3.7 4.2  CL 99 105  CO2 26 24  BUN 17 15  CREATININE 1.03 0.91  GLUCOSE 132* 113*  CALCIUM 9.6 8.6*   No results for input(s): LABPT, INR in the last 72 hours.  Neurologically intact HIp motion improved compared to 48 hours ago  Mild pain on ROM    Assessment/Plan: Right hip pain- All cultures remain negative and he is still having pain but symptoms are much better than 2 days ago. He will still need a hip arthroscopy to deal with the loose body but this can be done on an elective basis. He may be discharged later today and will follow up in the office with Dr. Victorino December later this week.     Pilar Plate Kwaku Mostafa 02/13/2019, 7:17 AM

## 2019-02-13 NOTE — Progress Notes (Signed)
Discharge instructions discussed with patient, verbalized agreement and understanding 

## 2019-02-14 LAB — BODY FLUID CULTURE: Culture: NO GROWTH

## 2019-02-15 LAB — CULTURE, BLOOD (ROUTINE X 2)
Culture: NO GROWTH
Culture: NO GROWTH

## 2019-03-02 NOTE — Pre-Procedure Instructions (Signed)
Ronald Morales  03/02/2019      [id number] - [place], [place] - [address] AT [address] [place] [address] Phone: [phone] Fax: [phone]    Your procedure is scheduled on March 07, 2019.  Report to [facility] Admitting at 1100 AM.  Call this number if you have problems the morning of surgery:  [phone]   Remember:  Do not eat after midnight.  You may drink clear liquids until 1000 AM the morning of surgery.  Clear liquids allowed are:   Water, Juice (non-citric and without pulp), Clear Tea, Black Coffee only and Gatorade  Please complete your PRE-SURGERY ENSURE that was provided to you by 1000 AM the morning of surgery.  Please, if able, drink it in one setting. DO NOT SIP.   Take these medicines the morning of surgery with A SIP OF WATER  Oxycodone-if needed Tylenol-if needed  Beginning now, STOP taking any Aspirin (unless otherwise instructed by your surgeon), Aleve, Naproxen, Ibuprofen, Motrin, Advil, Goody's, BC's, all herbal medications, fish oil, and all vitamins   Day of surgery:  Do not wear jewelry  Do not wear lotions, powders, or colognes, or deodorant.  Men may shave face and neck.  Do not bring valuables to the hospital.  [facility] is not responsible for any belongings or valuables.  IF you are a smoker, DO NOT Smoke 24 hours prior to surgery   IF you wear a CPAP at night please bring your mask, tubing, and machine the morning of surgery    Remember that you must have someone to transport you home after your surgery, and remain with you for 24 hours if you are discharged the same day.  Contacts, dentures or bridgework may not be worn into surgery.  Leave your suitcase in the car.  After surgery it may be brought to your room.  For patients admitted to the hospital, discharge time will be determined by your treatment team.  Patients discharged the day of surgery  will not be allowed to drive home.    [place]- Preparing For Surgery  Before surgery, you can play an important role. Because skin is not sterile, your skin needs to be as free of germs as possible. You can reduce the number of germs on your skin by washing with CHG (chlorahexidine gluconate) Soap before surgery.  CHG is an antiseptic cleaner which kills germs and bonds with the skin to continue killing germs even after washing.    Oral Hygiene is also important to reduce your risk of infection.  Remember - BRUSH YOUR TEETH THE MORNING OF SURGERY WITH YOUR REGULAR TOOTHPASTE  Please do not use if you have an allergy to CHG or antibacterial soaps. If your skin becomes reddened/irritated stop using the CHG.  Do not shave (including legs and underarms) for at least 48 hours prior to first CHG shower. It is OK to shave your face.  Please follow these instructions carefully.   1. Shower the NIGHT BEFORE SURGERY and the MORNING OF SURGERY with CHG.   2. If you chose to wash your hair, wash your hair first as usual with your normal shampoo.  3. After you shampoo, rinse your hair and body thoroughly to remove the shampoo.  4. Use CHG as you would any other liquid soap. You can apply CHG directly to the skin and wash gently with a scrungie or a  clean washcloth.   5. Apply the CHG Soap to your body ONLY FROM THE NECK DOWN.  Do not use on open wounds or open sores. Avoid contact with your eyes, ears, mouth and genitals (private parts). Wash Face and genitals (private parts)  with your normal soap.  6. Wash thoroughly, paying special attention to the area where your surgery will be performed.  7. Thoroughly rinse your body with warm water from the neck down.  8. DO NOT shower/wash with your normal soap after using and rinsing off the CHG Soap.  9. Pat yourself dry with a CLEAN TOWEL.  10. Wear CLEAN PAJAMAS to bed the night before surgery, wear comfortable clothes the morning of  surgery  11. Place CLEAN SHEETS on your bed the night of your first shower and DO NOT SLEEP WITH PETS.  Day of Surgery: Shower as above Do not apply any deodorants/lotions.  Please wear clean clothes to the hospital/surgery center.   Remember to brush your teeth WITH YOUR REGULAR TOOTHPASTE.  Please read over the following fact sheets that you were given.

## 2019-03-03 ENCOUNTER — Other Ambulatory Visit (HOSPITAL_COMMUNITY)
Admission: RE | Admit: 2019-03-03 | Discharge: 2019-03-03 | Disposition: A | Discharge status: Home / Self Care | Payer: Managed Care, Other (non HMO) | Source: Ambulatory Visit | Attending: Orthopedic Surgery | Admitting: Orthopedic Surgery

## 2019-03-03 ENCOUNTER — Other Ambulatory Visit: Payer: Self-pay

## 2019-03-03 ENCOUNTER — Encounter (HOSPITAL_COMMUNITY)
Admission: RE | Admit: 2019-03-03 | Discharge: 2019-03-03 | Disposition: A | Discharge status: Home / Self Care | Payer: Managed Care, Other (non HMO) | Source: Ambulatory Visit | Attending: Orthopedic Surgery | Admitting: Orthopedic Surgery

## 2019-03-03 ENCOUNTER — Encounter (HOSPITAL_COMMUNITY): Payer: Self-pay

## 2019-03-03 DIAGNOSIS — Z01818 Encounter for other preprocedural examination: Secondary | ICD-10-CM | POA: Insufficient documentation

## 2019-03-03 DIAGNOSIS — Z20828 Contact with and (suspected) exposure to other viral communicable diseases: Secondary | ICD-10-CM | POA: Diagnosis not present

## 2019-03-03 DIAGNOSIS — M24051 Loose body in right hip: Secondary | ICD-10-CM | POA: Diagnosis not present

## 2019-03-03 DIAGNOSIS — I10 Essential (primary) hypertension: Secondary | ICD-10-CM | POA: Diagnosis not present

## 2019-03-03 DIAGNOSIS — R001 Bradycardia, unspecified: Secondary | ICD-10-CM | POA: Diagnosis not present

## 2019-03-03 HISTORY — DX: Unspecified osteoarthritis, unspecified site: M19.90

## 2019-03-03 HISTORY — DX: Essential (primary) hypertension: I10

## 2019-03-03 LAB — CBC
HCT: 44.7 % (ref 39.0–52.0)
Hemoglobin: 14.7 g/dL (ref 13.0–17.0)
MCH: 30 pg (ref 26.0–34.0)
MCHC: 32.9 g/dL (ref 30.0–36.0)
MCV: 91.2 fL (ref 80.0–100.0)
Platelets: 297 10*3/uL (ref 150–400)
RBC: 4.9 MIL/uL (ref 4.22–5.81)
RDW: 13.3 % (ref 11.5–15.5)
WBC: 6.3 10*3/uL (ref 4.0–10.5)
nRBC: 0 % (ref 0.0–0.2)

## 2019-03-03 LAB — COMPREHENSIVE METABOLIC PANEL
ALT: 34 U/L (ref 0–44)
AST: 20 U/L (ref 15–41)
Albumin: 4 g/dL (ref 3.5–5.0)
Alkaline Phosphatase: 70 U/L (ref 38–126)
Anion gap: 10 (ref 5–15)
BUN: 12 mg/dL (ref 6–20)
CO2: 22 mmol/L (ref 22–32)
Calcium: 9.1 mg/dL (ref 8.9–10.3)
Chloride: 105 mmol/L (ref 98–111)
Creatinine, Ser: 1.02 mg/dL (ref 0.61–1.24)
GFR calc Af Amer: >60 mL/min (ref >60)
GFR calc non Af Amer: >60 mL/min (ref >60)
Glucose, Bld: 92 mg/dL (ref 70–99)
Potassium: 4.6 mmol/L (ref 3.5–5.1)
Sodium: 137 mmol/L (ref 135–145)
Total Bilirubin: 0.7 mg/dL (ref 0.3–1.2)
Total Protein: 7.1 g/dL (ref 6.5–8.1)

## 2019-03-03 LAB — SURGICAL PCR SCREEN
MRSA, PCR: NEGATIVE
Staphylococcus aureus: NEGATIVE

## 2019-03-03 LAB — SARS CORONAVIRUS 2 (TAT 6-24 HRS): SARS Coronavirus 2: NEGATIVE

## 2019-03-03 NOTE — Progress Notes (Signed)
PCP: None Cardiologist: Denies  EKG: Today CXR: None ECHO: denies Stress Test: denies Cardiac Cath: denies  Instructed no smoking 24 hrs prior to surgery.  Pre-surgery Ensure provided.  Patient denies shortness of breath, fever, cough, and chest pain at PAT appointment.  Patient verbalized understanding of instructions provided today at the PAT appointment.  Patient asked to review instructions at home and day of surgery.

## 2019-03-06 NOTE — Progress Notes (Signed)
Anesthesia Chart Review:  Case: [id number] Date/Time: 03/07/19 1245   Procedure: Right hip arthroscopy with loose body removal and possible femoroplasty (Right ) - 2.5 hrs   Anesthesia type: Choice   Pre-op diagnosis: Right hip loose body   Location: [facility] OR ROOM 07 / [facility] [facility]   Surgeon: Nicholes Stairs, MD      DISCUSSION: Patient is a 43 year old male scheduled for the above procedure.  History includes smoking, HTN. 03/03/19 presurgical COVID test negative.   He is not currently taking any medications for HTN, but BP reading 109/74 at PAT.  If no acute changes then I anticipate that he can proceed as planned.   VS: BP 109/74   Pulse 67   Temp 36.6 C   Resp 18   Ht 5\' 11"  (1.803 m)   Wt 76.1 kg   SpO2 100%   BMI 23.40 kg/m   PROVIDERS: Patient, No Pcp Per   LABS: Labs reviewed: Acceptable for surgery. (all labs ordered are listed, but only abnormal results are displayed)  Labs Reviewed  SURGICAL PCR SCREEN  CBC  COMPREHENSIVE METABOLIC PANEL     IMAGES: DG right hip 02/06/19: IMPRESSION: 1. 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 This could predispose to femoroacetabular impingement. 2. No acute bony findings.   EKG: 03/06/19: Sinus bradycardia at 59 bpm Minimal voltage criteria for LVH, may be normal variant Early repolarization pattern Borderline ECG No previous tracing Confirmed by Glenetta Hew [phone]) on 03/03/2019 6:36:29 PM   CV: N/A  Past Medical History:  Diagnosis Date  . Arthritis   . Hypertension    July 2020, not currently taking meds    Past Surgical History:  Procedure Laterality Date  . KIDNEY SURGERY      MEDICATIONS: . acetaminophen (TYLENOL) 500 MG tablet  . amLODipine (NORVASC) 5 MG tablet  . diphenhydramine-acetaminophen (TYLENOL PM) 25-500 MG TABS tablet  . ibuprofen (ADVIL) 200 MG tablet  . naproxen (NAPROSYN) 500 MG tablet  . oxyCODONE (OXY IR/ROXICODONE) 5 MG immediate release tablet   No  current facility-administered medications for this encounter.   He is not currently taking amlodipine or Naprosyn.   Myra Gianotti, PA-C Surgical Short Stay/Anesthesiology [facility] Phone [phone] [facility] Phone [phone] 03/06/2019 9:39 AM

## 2019-03-06 NOTE — Anesthesia Preprocedure Evaluation (Addendum)
Anesthesia Evaluation  Patient identified by MRN, date of birth, ID band Patient awake    Reviewed: Allergy & Precautions, NPO status , Patient's Chart, lab work & pertinent test results  History of Anesthesia Complications Negative for: history of anesthetic complications  Airway Mallampati: II  TM Distance: <3 FB Neck ROM: Full    Dental no notable dental hx.    Pulmonary Current Smoker and Patient abstained from smoking.,    Pulmonary exam normal        Cardiovascular hypertension, Normal cardiovascular exam     Neuro/Psych negative neurological ROS     GI/Hepatic negative GI ROS, (+)     substance abuse  marijuana use,   Endo/Other  negative endocrine ROS  Renal/GU negative Renal ROS     Musculoskeletal  (+) Arthritis ,   Abdominal   Peds  Hematology negative hematology ROS (+)   Anesthesia Other Findings Day of surgery medications reviewed with the patient.  Reproductive/Obstetrics                         Anesthesia Physical Anesthesia Plan  ASA: II  Anesthesia Plan: General   Post-op Pain Management:    Induction: Intravenous  PONV Risk Score and Plan: 3 and Treatment may vary due to age or medical condition, Ondansetron, Dexamethasone and Midazolam  Airway Management Planned: Oral ETT  Additional Equipment: None  Intra-op Plan:   Post-operative Plan: Extubation in OR  Informed Consent: I have reviewed the patients History and Physical, chart, labs and discussed the procedure including the risks, benefits and alternatives for the proposed anesthesia with the patient or authorized representative who has indicated his/her understanding and acceptance.     Dental advisory given  Plan Discussed with: CRNA  Anesthesia Plan Comments: (PAT note written 03/06/2019 by Myra Gianotti, PA-C. )     Anesthesia Quick Evaluation

## 2019-03-07 ENCOUNTER — Encounter (HOSPITAL_COMMUNITY): Payer: Self-pay | Admitting: Anesthesiology

## 2019-03-07 ENCOUNTER — Ambulatory Visit (HOSPITAL_COMMUNITY): Payer: Managed Care, Other (non HMO)

## 2019-03-07 ENCOUNTER — Ambulatory Visit (HOSPITAL_COMMUNITY)
Admission: RE | Admit: 2019-03-07 | Discharge: 2019-03-07 | Disposition: A | Discharge status: Home / Self Care | Payer: Managed Care, Other (non HMO) | Attending: Orthopedic Surgery | Admitting: Orthopedic Surgery

## 2019-03-07 ENCOUNTER — Ambulatory Visit (HOSPITAL_COMMUNITY): Payer: Managed Care, Other (non HMO) | Admitting: Vascular Surgery

## 2019-03-07 ENCOUNTER — Encounter (HOSPITAL_COMMUNITY)
Admission: RE | Disposition: A | Discharge status: Home / Self Care | Payer: Self-pay | Source: Home / Self Care | Attending: Orthopedic Surgery

## 2019-03-07 ENCOUNTER — Ambulatory Visit (HOSPITAL_COMMUNITY): Payer: Managed Care, Other (non HMO) | Admitting: Anesthesiology

## 2019-03-07 ENCOUNTER — Other Ambulatory Visit: Payer: Self-pay

## 2019-03-07 DIAGNOSIS — M25851 Other specified joint disorders, right hip: Secondary | ICD-10-CM | POA: Diagnosis not present

## 2019-03-07 DIAGNOSIS — Y990 Civilian activity done for income or pay: Secondary | ICD-10-CM | POA: Diagnosis not present

## 2019-03-07 DIAGNOSIS — I10 Essential (primary) hypertension: Secondary | ICD-10-CM | POA: Insufficient documentation

## 2019-03-07 DIAGNOSIS — W19XXXA Unspecified fall, initial encounter: Secondary | ICD-10-CM | POA: Insufficient documentation

## 2019-03-07 DIAGNOSIS — M25451 Effusion, right hip: Secondary | ICD-10-CM | POA: Diagnosis present

## 2019-03-07 DIAGNOSIS — Z419 Encounter for procedure for purposes other than remedying health state, unspecified: Secondary | ICD-10-CM

## 2019-03-07 DIAGNOSIS — F17210 Nicotine dependence, cigarettes, uncomplicated: Secondary | ICD-10-CM | POA: Diagnosis not present

## 2019-03-07 DIAGNOSIS — S73101A Unspecified sprain of right hip, initial encounter: Secondary | ICD-10-CM | POA: Insufficient documentation

## 2019-03-07 DIAGNOSIS — M659 Synovitis and tenosynovitis, unspecified: Secondary | ICD-10-CM | POA: Diagnosis not present

## 2019-03-07 DIAGNOSIS — M1611 Unilateral primary osteoarthritis, right hip: Secondary | ICD-10-CM | POA: Diagnosis not present

## 2019-03-07 DIAGNOSIS — T884XXA Failed or difficult intubation, initial encounter: Secondary | ICD-10-CM

## 2019-03-07 HISTORY — PX: HIP ARTHROSCOPY: SHX668

## 2019-03-07 HISTORY — DX: Failed or difficult intubation, initial encounter: T88.4XXA

## 2019-03-07 SURGERY — ARTHROSCOPY HIP
Anesthesia: General | Laterality: Right

## 2019-03-07 MED ORDER — LIDOCAINE 2% (20 MG/ML) 5 ML SYRINGE
INTRAMUSCULAR | Status: DC | PRN
  Administered 2019-03-07: 80 mg via INTRAVENOUS

## 2019-03-07 MED ORDER — ONDANSETRON 4 MG PO TBDP: 4.0000 mg | ORAL_TABLET | Freq: Three times a day (TID) | ORAL | 0 refills | Status: AC | PRN

## 2019-03-07 MED ORDER — MIDAZOLAM HCL 2 MG/2ML IJ SOLN
INTRAMUSCULAR | Status: AC
  Filled 2019-03-07: qty 2

## 2019-03-07 MED ORDER — OXYCODONE HCL 5 MG PO TABS
5.0000 mg | ORAL_TABLET | Freq: Once | ORAL | Status: AC | PRN
  Administered 2019-03-07: 5 mg via ORAL

## 2019-03-07 MED ORDER — OXYCODONE HCL 5 MG PO TABS: 5.0000 mg | ORAL_TABLET | ORAL | 0 refills | Status: DC | PRN

## 2019-03-07 MED ORDER — MIDAZOLAM HCL 5 MG/5ML IJ SOLN
INTRAMUSCULAR | Status: DC | PRN
  Administered 2019-03-07: 2 mg via INTRAVENOUS

## 2019-03-07 MED ORDER — SUGAMMADEX SODIUM 200 MG/2ML IV SOLN
INTRAVENOUS | Status: DC | PRN
  Administered 2019-03-07: 300 mg via INTRAVENOUS

## 2019-03-07 MED ORDER — SODIUM CHLORIDE 0.9 % IV SOLN
INTRAVENOUS | Status: DC | PRN
  Administered 2019-03-07: 25 ug/min via INTRAVENOUS

## 2019-03-07 MED ORDER — ONDANSETRON HCL 4 MG/2ML IJ SOLN
INTRAMUSCULAR | Status: DC | PRN
  Administered 2019-03-07: 4 mg via INTRAVENOUS

## 2019-03-07 MED ORDER — NAPROXEN 500 MG PO TABS: 500.0000 mg | ORAL_TABLET | Freq: Two times a day (BID) | ORAL | 0 refills | Status: AC

## 2019-03-07 MED ORDER — PROMETHAZINE HCL 25 MG/ML IJ SOLN: 6.2500 mg | INTRAMUSCULAR | Status: DC | PRN

## 2019-03-07 MED ORDER — OXYCODONE HCL 5 MG PO TABS
ORAL_TABLET | ORAL | Status: AC
  Filled 2019-03-07: qty 1

## 2019-03-07 MED ORDER — DOXYCYCLINE HYCLATE 20 MG PO TABS: 20.0000 mg | ORAL_TABLET | Freq: Every day | ORAL | 0 refills | Status: AC

## 2019-03-07 MED ORDER — FENTANYL CITRATE (PF) 250 MCG/5ML IJ SOLN
INTRAMUSCULAR | Status: AC
  Filled 2019-03-07: qty 5

## 2019-03-07 MED ORDER — PHENYLEPHRINE HCL (PRESSORS) 10 MG/ML IV SOLN
INTRAVENOUS | Status: DC | PRN
  Administered 2019-03-07 (×2): 80 ug via INTRAVENOUS

## 2019-03-07 MED ORDER — 0.9 % SODIUM CHLORIDE (POUR BTL) OPTIME
TOPICAL | Status: DC | PRN
  Administered 2019-03-07: 200 mL

## 2019-03-07 MED ORDER — CEFAZOLIN SODIUM-DEXTROSE 2-4 GM/100ML-% IV SOLN
2.0000 g | INTRAVENOUS | Status: AC
  Administered 2019-03-07: 2 g via INTRAVENOUS
  Filled 2019-03-07: qty 100

## 2019-03-07 MED ORDER — BUPIVACAINE-EPINEPHRINE 0.25% -1:200000 IJ SOLN
INTRAMUSCULAR | Status: DC | PRN
  Administered 2019-03-07: 10 mL

## 2019-03-07 MED ORDER — CHLORHEXIDINE GLUCONATE 4 % EX LIQD: 60.0000 mL | Freq: Once | CUTANEOUS | Status: DC

## 2019-03-07 MED ORDER — LACTATED RINGERS IV SOLN
INTRAVENOUS | Status: DC
  Administered 2019-03-07: 11:00:00 via INTRAVENOUS

## 2019-03-07 MED ORDER — PROPOFOL 10 MG/ML IV BOLUS
INTRAVENOUS | Status: AC
  Filled 2019-03-07: qty 20

## 2019-03-07 MED ORDER — ACETAMINOPHEN 10 MG/ML IV SOLN
INTRAVENOUS | Status: AC
  Filled 2019-03-07: qty 100

## 2019-03-07 MED ORDER — ACETAMINOPHEN 10 MG/ML IV SOLN
1000.0000 mg | Freq: Once | INTRAVENOUS | Status: DC | PRN
  Administered 2019-03-07: 15:00:00 1000 mg via INTRAVENOUS

## 2019-03-07 MED ORDER — ROCURONIUM BROMIDE 50 MG/5ML IV SOSY
PREFILLED_SYRINGE | INTRAVENOUS | Status: DC | PRN
  Administered 2019-03-07: 50 mg via INTRAVENOUS
  Administered 2019-03-07: 10 mg via INTRAVENOUS

## 2019-03-07 MED ORDER — BUPIVACAINE-EPINEPHRINE (PF) 0.25% -1:200000 IJ SOLN
INTRAMUSCULAR | Status: AC
  Filled 2019-03-07: qty 30

## 2019-03-07 MED ORDER — DEXAMETHASONE SODIUM PHOSPHATE 10 MG/ML IJ SOLN
INTRAMUSCULAR | Status: DC | PRN
  Administered 2019-03-07: 10 mg via INTRAVENOUS

## 2019-03-07 MED ORDER — PROPOFOL 10 MG/ML IV BOLUS
INTRAVENOUS | Status: DC | PRN
  Administered 2019-03-07: 25 mg via INTRAVENOUS
  Administered 2019-03-07: 175 mg via INTRAVENOUS

## 2019-03-07 MED ORDER — FENTANYL CITRATE (PF) 100 MCG/2ML IJ SOLN
INTRAMUSCULAR | Status: DC | PRN
  Administered 2019-03-07 (×3): 50 ug via INTRAVENOUS

## 2019-03-07 MED ORDER — LACTATED RINGERS IV SOLN
INTRAVENOUS | Status: DC | PRN
  Administered 2019-03-07 (×2): via INTRAVENOUS

## 2019-03-07 MED ORDER — HYDROMORPHONE HCL 1 MG/ML IJ SOLN
INTRAMUSCULAR | Status: AC
  Filled 2019-03-07: qty 1

## 2019-03-07 MED ORDER — OXYCODONE HCL 5 MG/5ML PO SOLN: 5.0000 mg | Freq: Once | ORAL | Status: AC | PRN

## 2019-03-07 MED ORDER — HYDROMORPHONE HCL 1 MG/ML IJ SOLN
0.2500 mg | INTRAMUSCULAR | Status: DC | PRN
  Administered 2019-03-07 (×3): 0.5 mg via INTRAVENOUS

## 2019-03-07 MED ORDER — DEXMEDETOMIDINE HCL 200 MCG/2ML IV SOLN
INTRAVENOUS | Status: DC | PRN
  Administered 2019-03-07: 12 ug via INTRAVENOUS

## 2019-03-07 MED ORDER — METHOCARBAMOL 500 MG PO TABS: 500.0000 mg | ORAL_TABLET | Freq: Four times a day (QID) | ORAL | 1 refills | Status: DC | PRN

## 2019-03-07 MED ORDER — SODIUM CHLORIDE 0.9 % IR SOLN
Status: DC | PRN
  Administered 2019-03-07 (×4): 3000 mL

## 2019-03-07 SURGICAL SUPPLY — 40 items
BLADE SURG 11 STRL SS (BLADE) ×3 IMPLANT
BUR ROUND HI FLUTE 8 4X19 (BURR) ×1 IMPLANT
BURR ROUND HI FLUTE 8 4X19 (BURR) ×3
CHLORAPREP W/TINT 26 (MISCELLANEOUS) ×3 IMPLANT
COVER PERINEAL POST (MISCELLANEOUS) ×3 IMPLANT
COVER WAND RF STERILE (DRAPES) ×3 IMPLANT
DECANTER SPIKE VIAL GLASS SM (MISCELLANEOUS) ×3 IMPLANT
DISSECTOR 4.2MMX19CM HL (MISCELLANEOUS) ×3 IMPLANT
DRAPE C-ARM 42X72 X-RAY (DRAPES) ×3 IMPLANT
DRAPE STERI IOBAN 125X83 (DRAPES) ×3 IMPLANT
DRSG EMULSION OIL 3X3 NADH (GAUZE/BANDAGES/DRESSINGS) ×3 IMPLANT
DRSG TEGADERM 4X4.75 (GAUZE/BANDAGES/DRESSINGS) ×3 IMPLANT
GAUZE SPONGE 4X4 12PLY STRL (GAUZE/BANDAGES/DRESSINGS) ×3 IMPLANT
GAUZE SPONGE 4X4 12PLY STRL LF (GAUZE/BANDAGES/DRESSINGS) ×3 IMPLANT
GLOVE BIO SURGEON STRL SZ7.5 (GLOVE) ×3 IMPLANT
GLOVE BIOGEL PI IND STRL 8 (GLOVE) ×1 IMPLANT
GLOVE BIOGEL PI INDICATOR 8 (GLOVE) ×2
GOWN STRL REUS W/ TWL LRG LVL3 (GOWN DISPOSABLE) ×3 IMPLANT
GOWN STRL REUS W/ TWL XL LVL3 (GOWN DISPOSABLE) ×1 IMPLANT
GOWN STRL REUS W/TWL LRG LVL3 (GOWN DISPOSABLE) ×6
GOWN STRL REUS W/TWL XL LVL3 (GOWN DISPOSABLE) ×2
KIT BASIN OR (CUSTOM PROCEDURE TRAY) ×3 IMPLANT
KIT HIP ARTHROSCOPY (SET/KITS/TRAYS/PACK) ×6 IMPLANT
KIT TURNOVER KIT B (KITS) ×3 IMPLANT
MANIFOLD NEPTUNE II (INSTRUMENTS) ×6 IMPLANT
MARKER SKIN DUAL TIP RULER LAB (MISCELLANEOUS) ×3 IMPLANT
PACK SURGICAL SETUP 50X90 (CUSTOM PROCEDURE TRAY) ×3 IMPLANT
PAD ARMBOARD 7.5X6 YLW CONV (MISCELLANEOUS) ×6 IMPLANT
SPONGE LAP 18X18 RF (DISPOSABLE) ×3 IMPLANT
SUT ETHILON 4 0 PS 2 18 (SUTURE) ×3 IMPLANT
SUT FIBERWIRE #2 38 T-5 BLUE (SUTURE) ×3
SUTURE FIBERWR #2 38 T-5 BLUE (SUTURE) ×1 IMPLANT
SYR 50ML LL SCALE MARK (SYRINGE) ×3 IMPLANT
TAPE CLOTH 4X10 WHT NS (GAUZE/BANDAGES/DRESSINGS) ×9 IMPLANT
TOWEL GREEN STERILE (TOWEL DISPOSABLE) ×6 IMPLANT
TUBE CONNECTING 12'X1/4 (SUCTIONS) ×1
TUBE CONNECTING 12X1/4 (SUCTIONS) ×2 IMPLANT
TUBING ARTHROSCOPY IRRIG 16FT (MISCELLANEOUS) ×3 IMPLANT
WAND APOLLO RF 50D ABLATOR (BUR) ×3 IMPLANT
WATER STERILE IRR 1000ML POUR (IV SOLUTION) ×3 IMPLANT

## 2019-03-07 NOTE — Anesthesia Procedure Notes (Signed)
Procedure Name: Intubation Date/Time: 03/07/2019 1:09 PM Performed by: Brennan Bailey, MD Pre-anesthesia Checklist: Patient identified, Emergency Drugs available, Suction available and Patient being monitored Patient Re-evaluated:Patient Re-evaluated prior to induction Oxygen Delivery Method: Circle System Utilized Preoxygenation: Pre-oxygenation with 100% oxygen Induction Type: IV induction Ventilation: Mask ventilation without difficulty Laryngoscope Size: Glidescope and 3 Grade View: Grade III Tube type: Oral Tube size: 7.5 mm Number of attempts: 3 Airway Equipment and Method: Stylet,  Video-laryngoscopy and Rigid stylet Placement Confirmation: ETT inserted through vocal cords under direct vision,  positive ETCO2 and breath sounds checked- equal and bilateral Secured at: 21 cm Tube secured with: Tape Dental Injury: Teeth and Oropharynx as per pre-operative assessment  Difficulty Due To: Difficulty was unanticipated and Difficult Airway- due to anterior larynx Future Recommendations: Recommend- induction with short-acting agent, and alternative techniques readily available Comments: First attempt by CRNA with Mac3 unable to view glottis. Second attempt by myself with Sabra Heck 2 with grade 3 view, anterior airway. Mask ventilated between attempts with SpO2 >98% throughout. Final attempt with Glidescope #3 with atraumatic intubation. Recommend Glidescope for future intubations. Daiva Huge, MD

## 2019-03-07 NOTE — Transfer of Care (Signed)
Immediate Anesthesia Transfer of Care Note  Patient: Ronald Morales  Procedure(s) Performed: Right hip arthroscopy with debridementl and  femoroplasty (Right )  Patient Location: PACU  Anesthesia Type:General  Level of Consciousness: drowsy  Airway & Oxygen Therapy: Patient Spontanous Breathing and Patient connected to nasal cannula oxygen  Post-op Assessment: Report given to RN and Post -op Vital signs reviewed and stable  Post vital signs: Reviewed and stable  Last Vitals:  Vitals Value Taken Time  BP 137/94 03/07/19 1502  Temp    Pulse 95 03/07/19 1507  Resp 22 03/07/19 1507  SpO2 98 % 03/07/19 1507  Vitals shown include unvalidated device data.  Last Pain:  Vitals:   03/07/19 1130  TempSrc:   PainSc: 0-No pain         Complications: No apparent anesthesia complications

## 2019-03-07 NOTE — Anesthesia Postprocedure Evaluation (Signed)
Anesthesia Post Note  Patient: Ronald Morales  Procedure(s) Performed: Right hip arthroscopy with debridementl and  femoroplasty (Right )     Patient location during evaluation: PACU Anesthesia Type: General Level of consciousness: awake and alert and oriented Pain management: pain level controlled Vital Signs Assessment: post-procedure vital signs reviewed and stable Respiratory status: spontaneous breathing, nonlabored ventilation and respiratory function stable Cardiovascular status: blood pressure returned to baseline Postop Assessment: no apparent nausea or vomiting Anesthetic complications: no    Last Vitals:  Vitals:   03/07/19 1550 03/07/19 1605  BP: (!) 136/95 (!) 138/97  Pulse: 73 67  Resp: 13 12  Temp:  (!) 36.3 C  SpO2: 100% 100%    Last Pain:  Vitals:   03/07/19 1605  TempSrc:   PainSc: [place]

## 2019-03-07 NOTE — Op Note (Signed)
Date of Surgery: 03/07/2019  INDICATIONS: Ronald Morales is a 43 y.o.-year-old male with a right hip pain, large effusion of unknown etiology, as well as probable loose body noted on preoperative MRI and femoral acetabular impingement.  The onset of his symptoms was acute in nature following a low-energy trauma with a fall from a few steps.  He was admitted to the hospital for an effusion that ended up being sterile on cultures.  He had persistent pain and inability to return to work or ambulate with any normal cadence.  He presents today for hip arthroscopy with possible loose body resection as well as femoroplasty to address his underlying hip pathology.;  The patient did consent to the procedure after discussion of the risks and benefits.  PREOPERATIVE DIAGNOSIS:  1. Right hip femoral acetabular impingement 2.  Right hip effusion 3.  Right hip probable loose body  POSTOPERATIVE DIAGNOSIS:  1.  Right hip femoral acetabular impingement 2.  Right hip anterior superior degenerative labral tearing  PROCEDURE:  1.  Right hip arthroscopic debridement of anterior superior labrum 2.  Right hip arthroscopic femoroplasty  SURGEON: Maryan RuedJason P Hayato Guaman, M.D.  ASSIST: None.  ANESTHESIA:  general  IV FLUIDS AND URINE: See anesthesia.  ESTIMATED BLOOD LOSS: 10 mL.  IMPLANTS: None  DRAINS: None  COMPLICATIONS: None.  DESCRIPTION OF PROCEDURE:  The patient was on a hand fracture table.  A well-padded oversized peroneal post was placed with well-padded traction boots applied to both right and left leg.  SCD was on the contralateral limb.  Timeout was performed by myself, the surgeon, to confirm correct site of surgery and confirm the procedure to be performed, and confirm IV antibiotics had been given.  Provisional traction was applied to the hip placing adequate distraction to into the joint.  The right hip was then prepped and draped in the usual standard sterile fashion.  Femoral traction was  reapplied to the hip placing it in the position of slight flexion with neutral rotation and neutral abduction.   Next, standard standard anterior peritrochanteric portal was established just superior and anterior to the superior tip of the greater trochanter.  Needle localization with the aid of image intensification and air contrast arthrogram confirmed intra-articular placement.  A Nitinol wire was then placed, 11 blade used to make a stab incision and then a 4.5 [place] cannula was inserted atraumatically.  Next a mid anterior portal was established via direct visualization in the anterior triangle.  It was established approximately 7 cm distant at about a 45 angle off of the axial plane from the previously established anterior peritrochanteric portal.  Needle was inserted under direct visualization.  A Nitinol wire was then placed an 11 blade used to make a stab incision and a 5.0 cannula was inserted atraumatically.   Operative findings: On the diagnostic arthroscopy of this right hip there was no dissociation of the chondral labral junction.  He did have from about the 11 o'clock position to the 9 o'clock position some degenerative fraying of the acetabular labrum.  There was a large blood clot noted in the posterior aspect of the joint.  This was obscuring some of the view posteriorly.  This was lavaged and debrided with motorized shaver.  This did allow us to see that the posterior acetabular labrum was not torn.  Peripherally there was moderate synovitis.  Femoral head Cartage was normal.  The acetabular Cartledge was normal.  Colloid fossa, ligament teres, and fovea capitis were normal.   At  this time, we did an intraportal capsulotomy with a Beaver blade.  This was utilized to enlarge the capsular incision to allow for larger cannula insertion into work lateral and medial.  Motorized shaver was used to perform a gentle synovectomy.  The labrum was then debrided with the motorized shaver and  ArthroCare wand.  At this juncture we were satisfied that the intra-articular portion of the hip arthroscopy was complete.  We then removed gross traction placed a traction stitch in the inferior leaflet of the capsulotomy.  This allowed improved peripheral inspection of the junction of the anterior lateral and medial head neck junction.  There was noted to be loss of sphericity and femoral head offset which was consistent with preoperative imaging.  A motorized bur was then used in a systematic and deliberate meticulous fashion to perform a femoroplasty restoring native head and neck offset to restore the femoral head sphericity area to a dynamic examination at the conclusion revealed a residual bony conflict at extremes of flexion and internal rotation had been resolved.  After this the arthroscopic [place] was used to place a #2 FiberWire into both leaflets of the capsulotomy.  This was then tied with knot pusher to close the capsulotomy.  Next, a scopic cannulas were removed.  Skin incision was closed with 4-0 nylon.  Wounds were injected with quarter percent plain Marcaine with epinephrine.  The wounds were then dressed with Steri-Strips, Adaptic, 4 x 4's, and a Tegaderm.  Postop plan:  Touchdown weightbearing 3 weeks.  He will begin physical therapy next week.  He will be on the standard hip arthroscopy postoperative medication protocol to prevent heterotropic ossification and gastric ulcer.  I will see him in the office in 2 weeks.

## 2019-03-07 NOTE — Brief Op Note (Signed)
03/07/2019  3:12 PM  PATIENT:  Ronald Morales  43 y.o. male  PRE-OPERATIVE DIAGNOSIS:  Right hip loose body  POST-OPERATIVE DIAGNOSIS:  Right hip loose body  PROCEDURE:  Procedure(s) with comments: Right hip arthroscopy with debridementl and  femoroplasty (Right) - 2.5 hrs  SURGEON:  Surgeon(s) and Role:    * Stann Mainland, Elly Modena, MD - Primary  PHYSICIAN ASSISTANT:   ASSISTANTS: none   ANESTHESIA:   general  EBL:  20 mL   BLOOD ADMINISTERED:none  DRAINS: none   LOCAL MEDICATIONS USED:  MARCAINE     SPECIMEN:  No Specimen  DISPOSITION OF SPECIMEN:  N/A  COUNTS:  YES  TOURNIQUET:  * No tourniquets in log *  DICTATION: .Note written in EPIC  PLAN OF CARE: Discharge to home after PACU  PATIENT DISPOSITION:  PACU - hemodynamically stable.   Delay start of Pharmacological VTE agent (>24hrs) due to surgical blood loss or risk of bleeding: not applicable

## 2019-03-07 NOTE — Discharge Instructions (Signed)
°  March 07, 2019  [facility] [address] [place] [address]   Dear Mr. Ronald Morales,   I hope you are doing well after your surgery. During your recent procedure at [facility], your anesthesia team determined that you were a difficult intubation.  This means that there was difficulty in placing a breathing tube from your mouth into your windpipe. This procedure is commonly performed for general anesthetics for the purpose of providing oxygen and anesthetic gases during the operation. Routinely, this is done using an instrument, known as a laryngoscope, to visualize the vocal cords and directly place the breathing tube into the trachea.  During your anesthesia, we found that we needed a special tool to place your breathing tube. We used a "Glidescope" for this procedure and our technique is documented in our anesthetic record.  This record is available should others need to review it. If you have any questions about the procedure, please let me know and I will be happy to help. It is very important for you to tell your Anesthesiologist when you have future operations, that you are a difficult intubation so that other arrangements, personnel, or instruments can be obtained to ensure your safety.   It was a pleasure to take care of you for your surgery, and I hope the best for you in the future. Should you have any questions, please feel free to contact me at [phone].   Brennan Bailey, MD Anesthesiology        Orthopedic surgery discharge instructions:  -He will be touchdown weightbearing to the right lower extremity for 3 weeks.  He is utilizing crutches for mobilization.  You may begin moving your hip in a rotational fashion as soon as possible.  -You should apply ice to the right hip for 30 minutes out of each hour that you are awake.  -Maintain your postoperative bandage until follow-up appointment.  This is a waterproof bandage and you may shower with this on as  soon as you feel able.  Do not submerge underwater.  -Over-the-counter medications you should take:   -Zantac 150 mg twice per day while taking the Naprosyn, which will be for 28 total days.   -At the completion of your 28 days of Naprosyn you should take an 81 mg aspirin once per day x4 weeks to prevent blood clots.    -You should take Colace 100 mg tablet twice per day to prevent constipation.   -Prescription medications take:   -Robaxin should be used as needed for muscle spasm   -Oxycodone to be used as needed for pain.  For mild to moderate pain use Tylenol 1000 mg every 6 hours.    -Doxycycline 20 mg tablet to be taken once per day for 28 days for protection of cartilage  -Return to see Dr. Stann Mainland in 2 weeks for routine postop care.  We will also begin physical therapy in the next 1 week.

## 2019-03-07 NOTE — H&P (Signed)
ORTHOPAEDIC H and P  REQUESTING PHYSICIAN: Yolonda Kidaogers,  Patrick, MD  PCP:  Patient, No Pcp Per  Chief Complaint: Right hip loose body  HPI: Ronald Morales is a 43 y.o. male who complains of debilitating right hip pain following a fall at work a couple of weeks back.  He was noted to have a large effusion and loose body in the hip joint.  He is here today for debridement and loose body removal.  We have previously discussed this at length in the office and is here today for the definitive management.  Past Medical History:  Diagnosis Date  . Arthritis   . Hypertension    July 2020, not currently taking meds   Past Surgical History:  Procedure Laterality Date  . KIDNEY SURGERY     Social History   Socioeconomic History  . Marital status: Single    Spouse name: Not on file  . Number of children: Not on file  . Years of education: Not on file  . Highest education level: Not on file  Occupational History  . Not on file  Social Needs  . Financial resource strain: Not on file  . Food insecurity    Worry: Not on file    Inability: Not on file  . Transportation needs    Medical: Not on file    Non-medical: Not on file  Tobacco Use  . Smoking status: Current Every Day Smoker    Packs/day: 0.50    Years: 12.00    Pack years: 6.00    Types: Cigarettes  . Smokeless tobacco: Never Used  . Tobacco comment: 1 pack last 2-3 days.  Substance and Sexual Activity  . Alcohol use: Yes    Comment: Occasional on the weekends  . Drug use: Yes    Types: Marijuana    Comment: Occasionally, as of 03/03/19 "last used 5 months ago"  . Sexual activity: Yes  Lifestyle  . Physical activity    Days per week: Not on file    Minutes per session: Not on file  . Stress: Not on file  Relationships  . Social Musicianconnections    Talks on phone: Not on file    Gets together: Not on file    Attends religious service: Not on file    Active member of club or organization: Not on file   Attends meetings of clubs or organizations: Not on file    Relationship status: Not on file  Other Topics Concern  . Not on file  Social History Narrative  . Not on file   Family History  Problem Relation Age of Onset  . Hypertension Mother    No Known Allergies Prior to Admission medications   Medication Sig Start Date End Date Taking? Authorizing Provider  acetaminophen (TYLENOL) 500 MG tablet Take 1,000 mg by mouth every 6 (six) hours as needed for moderate pain or headache.   Yes [provider]  diphenhydramine-acetaminophen (TYLENOL PM) 25-500 MG TABS tablet Take 2 tablets by mouth at bedtime as needed (sleep).   Yes [provider]  ibuprofen (ADVIL) 200 MG tablet Take 400 mg by mouth every 6 (six) hours as needed for headache or moderate pain.   Yes [provider]  oxyCODONE (OXY IR/ROXICODONE) 5 MG immediate release tablet Take 1 tablet (5 mg total) by mouth every 4 (four) hours as needed for moderate pain or breakthrough pain (Hold & Call MD if SBP<90, HR<65, RR<10, O2<90, or altered mental status.). 02/13/19  Yes  Swayze, Ava, DO  amLODipine (NORVASC) 5 MG tablet Take 1 tablet (5 mg total) by mouth daily. Patient not taking: Reported on 02/28/2019 02/14/19   Swayze, Ava, DO  naproxen (NAPROSYN) 500 MG tablet Take 1 tablet (500 mg total) by mouth 2 (two) times daily. Patient not taking: Reported on 02/28/2019 02/13/19   Swayze, Ava, DO   No results found.  Positive ROS: All other systems have been reviewed and were otherwise negative with the exception of those mentioned in the HPI and as above.  Physical Exam: General: Alert, no acute distress Cardiovascular: No pedal edema Respiratory: No cyanosis, no use of accessory musculature GI: No organomegaly, abdomen is soft and non-tender Skin: No lesions in the area of chief complaint Neurologic: Sensation intact distally Psychiatric: Patient is competent for consent with normal mood and affect Lymphatic:  No axillary or cervical lymphadenopathy  MUSCULOSKELETAL:  Skin is warm and well-perfused.  No neurovascular deficits.  Assessment: 1.  Right hip loose body from traumatic event 2.  Right hip femoral acetabular impingement  Plan: -Our plan is for arthroscopic loose body retrieval today of the right hip.  We have also discussed femoroplasty given the obvious mechanical issues and impingement of that hip.  We discussed the risk of bleeding, infection, fracture, dislocation, arthritis, need for further surgery, persistent pain, numbness, and the risk of anesthesia.  He has provided informed consent.  -Our plan will be for discharge home postoperatively from PACU.    Nicholes Stairs, MD Cell [phone]    03/07/2019 12:41 PM

## 2019-03-08 ENCOUNTER — Encounter (HOSPITAL_COMMUNITY): Payer: Self-pay | Admitting: Orthopedic Surgery

## 2019-10-18 ENCOUNTER — Other Ambulatory Visit: Payer: Self-pay

## 2019-10-18 ENCOUNTER — Emergency Department (HOSPITAL_BASED_OUTPATIENT_CLINIC_OR_DEPARTMENT_OTHER): Payer: 59

## 2019-10-18 ENCOUNTER — Encounter (HOSPITAL_BASED_OUTPATIENT_CLINIC_OR_DEPARTMENT_OTHER): Payer: Self-pay

## 2019-10-18 ENCOUNTER — Emergency Department (HOSPITAL_BASED_OUTPATIENT_CLINIC_OR_DEPARTMENT_OTHER)
Admission: EM | Admit: 2019-10-18 | Discharge: 2019-10-18 | Disposition: A | Discharge status: Home / Self Care | Payer: 59 | Attending: Emergency Medicine | Admitting: Emergency Medicine

## 2019-10-18 DIAGNOSIS — F17210 Nicotine dependence, cigarettes, uncomplicated: Secondary | ICD-10-CM | POA: Insufficient documentation

## 2019-10-18 DIAGNOSIS — Y93E6 Activity, residential relocation: Secondary | ICD-10-CM | POA: Insufficient documentation

## 2019-10-18 DIAGNOSIS — I10 Essential (primary) hypertension: Secondary | ICD-10-CM | POA: Diagnosis not present

## 2019-10-18 DIAGNOSIS — X500XXA Overexertion from strenuous movement or load, initial encounter: Secondary | ICD-10-CM | POA: Insufficient documentation

## 2019-10-18 DIAGNOSIS — Y999 Unspecified external cause status: Secondary | ICD-10-CM | POA: Insufficient documentation

## 2019-10-18 DIAGNOSIS — Z79899 Other long term (current) drug therapy: Secondary | ICD-10-CM | POA: Insufficient documentation

## 2019-10-18 DIAGNOSIS — F1210 Cannabis abuse, uncomplicated: Secondary | ICD-10-CM | POA: Insufficient documentation

## 2019-10-18 DIAGNOSIS — Y92019 Unspecified place in single-family (private) house as the place of occurrence of the external cause: Secondary | ICD-10-CM | POA: Diagnosis not present

## 2019-10-18 DIAGNOSIS — S8991XA Unspecified injury of right lower leg, initial encounter: Secondary | ICD-10-CM | POA: Diagnosis present

## 2019-10-18 DIAGNOSIS — S86911A Strain of unspecified muscle(s) and tendon(s) at lower leg level, right leg, initial encounter: Secondary | ICD-10-CM | POA: Diagnosis not present

## 2019-10-18 MED ORDER — MELOXICAM 15 MG PO TABS: 7.5000 mg | ORAL_TABLET | Freq: Every day | ORAL | 0 refills | Status: DC

## 2019-10-18 MED ORDER — DEXAMETHASONE 4 MG PO TABS: 4.0000 mg | ORAL_TABLET | Freq: Two times a day (BID) | ORAL | 0 refills | Status: DC

## 2019-10-18 MED FILL — MELOXICAM 15 MG TABLET: 15 | 14 days supply | Qty: 7 | Fill #0

## 2019-10-18 MED FILL — DEXAMETHASONE 4 MG TABLET: 4 | 3 days supply | Qty: 6 | Fill #0

## 2019-10-18 NOTE — ED Provider Notes (Signed)
[facility] EMERGENCY DEPARTMENT Provider Note   CSN: [id number] Arrival date & time: 10/18/19  1512     History Chief Complaint  Patient presents with  . Joint Swelling    Ronald Morales is a 44 y.o. male.  HPI   44 year old male with right knee pain.  Denies any discrete trauma but was doing heavy lifting while moving starting Thursday and through this weekend.  He began having right knee pain and mild swelling on Friday.  Has been persistent since then.  Minimal pain at rest.  Worse with movement and bearing weight.  He has been using crutches which he had from prior hip surgery.  Denies any other acute pain.  No numbness or tingling.  Past Medical History:  Diagnosis Date  . Arthritis   . Difficult airway 03/07/2019   Grade 3 DL view. Successful Glidescope on 03/07/19. Stephannie Peters, MD  . Hypertension    July 2020, not currently taking meds   Patient Active Problem List   Diagnosis Date Noted  . Difficult airway 03/07/2019  . Effusion of right hip 02/10/2019  . Hip dysplasia 02/06/2019  . 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 HIP ARTHROSCOPY Right 03/07/2019   Procedure: Right hip arthroscopy with debridementl and  femoroplasty;  Surgeon: Yolonda Kida, MD;  Location: [facility] OR;  Service: Orthopedics;  Laterality: Right;  2.5 hrs  . KIDNEY SURGERY        Family History  Problem Relation Age of Onset  . Hypertension Mother     Social History   Tobacco Use  . Smoking status: Current Every Day Smoker    Packs/day: 0.50    Years: 12.00    Pack years: 6.00    Types: Cigarettes  . Smokeless tobacco: Never Used  . Tobacco comment: 1 pack last 2-3 days.  Substance Use Topics  . Alcohol use: Yes    Comment: Occasional on the weekends  . Drug use: Yes    Types: Marijuana    Comment: Occasionally, as of 03/03/19 "last used 5 months ago"    Home Medications Prior to Admission medications   Medication Sig Start  Date End Date Taking? Authorizing Provider  acetaminophen (TYLENOL) 500 MG tablet Take 1,000 mg by mouth every 6 (six) hours as needed for moderate pain or headache.    [provider]  dexamethasone (DECADRON) 4 MG tablet Take 1 tablet (4 mg total) by mouth 2 (two) times daily. 10/18/19   Raeford Razor, MD  ibuprofen (ADVIL) 200 MG tablet Take 400 mg by mouth every 6 (six) hours as needed for headache or moderate pain.    [provider]  meloxicam (MOBIC) 15 MG tablet Take 0.5 tablets (7.5 mg total) by mouth daily. 10/18/19   Raeford Razor, MD  methocarbamol (ROBAXIN) 500 MG tablet Take 1 tablet (500 mg total) by mouth every 6 (six) hours as needed for muscle spasms. 03/07/19   Yolonda Kida, MD  ondansetron (ZOFRAN ODT) 4 MG disintegrating tablet Take 1 tablet (4 mg total) by mouth every 8 (eight) hours as needed. 03/07/19   Yolonda Kida, MD  oxyCODONE (OXY IR/ROXICODONE) 5 MG immediate release tablet Take 1 tablet (5 mg total) by mouth every 4 (four) hours as needed for moderate pain or breakthrough pain. 03/07/19   Yolonda Kida, MD    Allergies    Patient has no known allergies.  Review of Systems   Review of Systems All systems  reviewed and negative, other than as noted in HPI.  Physical Exam Updated Vital Signs BP (!) 171/85 (BP Location: Left Arm)   Pulse 73   Temp 98.4 F (36.9 C) (Oral)   Resp 20   Ht 5\' 11"  (1.803 m)   Wt 77.1 kg   SpO2 100%   BMI 23.71 kg/m   Physical Exam Vitals and nursing note reviewed.  Constitutional:      General: He is not in acute distress.    Appearance: He is well-developed.  HENT:     Head: Normocephalic and atraumatic.  Eyes:     General:        Right eye: No discharge.        Left eye: No discharge.     Conjunctiva/sclera: Conjunctivae normal.  Cardiovascular:     Rate and Rhythm: Normal rate and regular rhythm.     Heart sounds: Normal heart sounds. No murmur. No friction rub. No gallop.     Pulmonary:     Effort: Pulmonary effort is normal. No respiratory distress.     Breath sounds: Normal breath sounds.  Abdominal:     General: There is no distension.     Palpations: Abdomen is soft.     Tenderness: There is no abdominal tenderness.  Musculoskeletal:        General: Tenderness present.     Cervical back: Neck supple.     Comments: Right is grossly normal in appearance and symmetric as compared to the left.  No skin lesions, erythema or appreciable effusion.  Tenderness to palpation over the lateral posterior aspect of the knee.  No ligamentous laxity appreciated.  Can actively range.  Skin:    General: Skin is warm and dry.  Neurological:     Mental Status: He is alert.  Psychiatric:        Behavior: Behavior normal.        Thought Content: Thought content normal.     ED Results / Procedures / Treatments   Labs (all labs ordered are listed, but only abnormal results are displayed) Labs Reviewed - No data to display  EKG None  Radiology DG Knee Complete 4 Views Right  Result Date: 10/18/2019 CLINICAL DATA:  Pain and swelling to right knee. Additional provided: Patient reports right knee pain for 4 days with swelling, no known injury. EXAM: RIGHT KNEE - COMPLETE 4+ VIEW COMPARISON:  12/01/2008 (images unavailable). COMPLICATIONS: There is normal bony alignment. No evidence of acute fracture. The joint spaces are maintained. Mild enthesophyte formation along the superior aspect of the patella. Small suprapatellar joint effusion. IMPRESSION: No evidence of fracture. Small suprapatellar joint effusion. Electronically Signed   By: 12/03/2008 DO   On: 10/18/2019 15:49    Procedures Procedures (including critical care time)  Medications Ordered in ED Medications - No data to display  ED Course  I have reviewed the triage vital signs and the nursing notes.  Pertinent labs & imaging results that were available during my care of the patient were reviewed by me and  considered in my medical decision making (see chart for details).    MDM Rules/Calculators/A&P                     44 year old male with acute right knee pain likely from overuse after moving this weekend.  No acute osseous abnormality noted on imaging.  Plan symptomatic treatment.  Activity as tolerated.  Orthopedic follow-up as needed.  Final Clinical Impression(s) / ED  Diagnoses Final diagnoses:  Strain of right knee, initial encounter    Rx / DC Orders ED Discharge Orders         Ordered    meloxicam (MOBIC) 15 MG tablet  Daily     10/18/19 1603    dexamethasone (DECADRON) 4 MG tablet  2 times daily     10/18/19 1603           Virgel Manifold, MD 10/18/19 [phone]

## 2019-10-18 NOTE — ED Triage Notes (Signed)
Pt states he did some moving this weekend. Monday his R knee started with pain and swelling that progressively got worse. Pt has a hx of childhood injury in that knee. Pt in triage on crutches.

## 2021-02-13 IMAGING — RF FLUORO GUIDED NEEDLE PLACEMENT AND/OR ASPIRATION
1 series · 5 of 5 positions shown · non-contrast
Comparison: none

CLINICAL DATA: Right hip pain, question of infection versus
effusion

[Series 2: cp_standard · 0.41mm/px · 2 acquisitions, 5 frames shown]
[im 1/2]
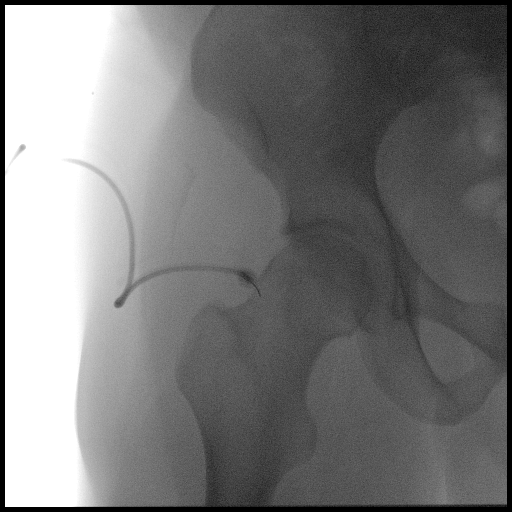
[im 1/2]
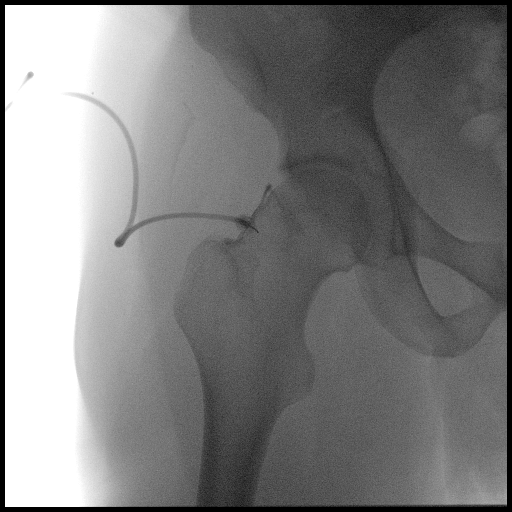
[im 1/2]
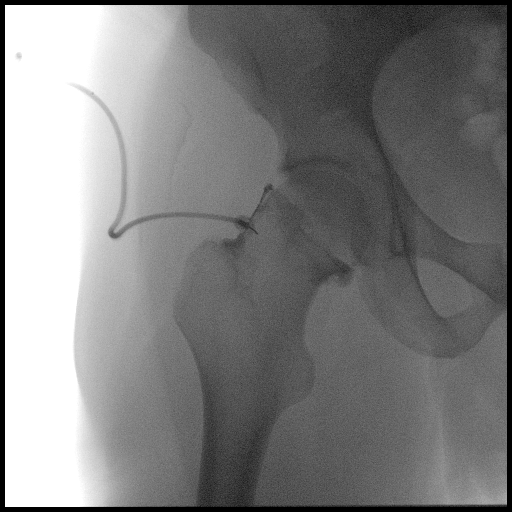
[im 1/2]
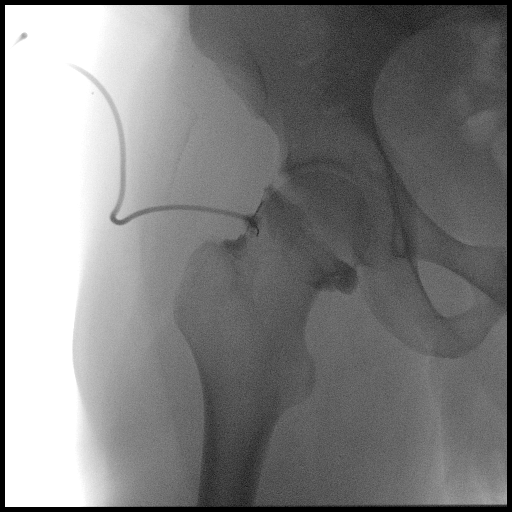
[im 2/2]
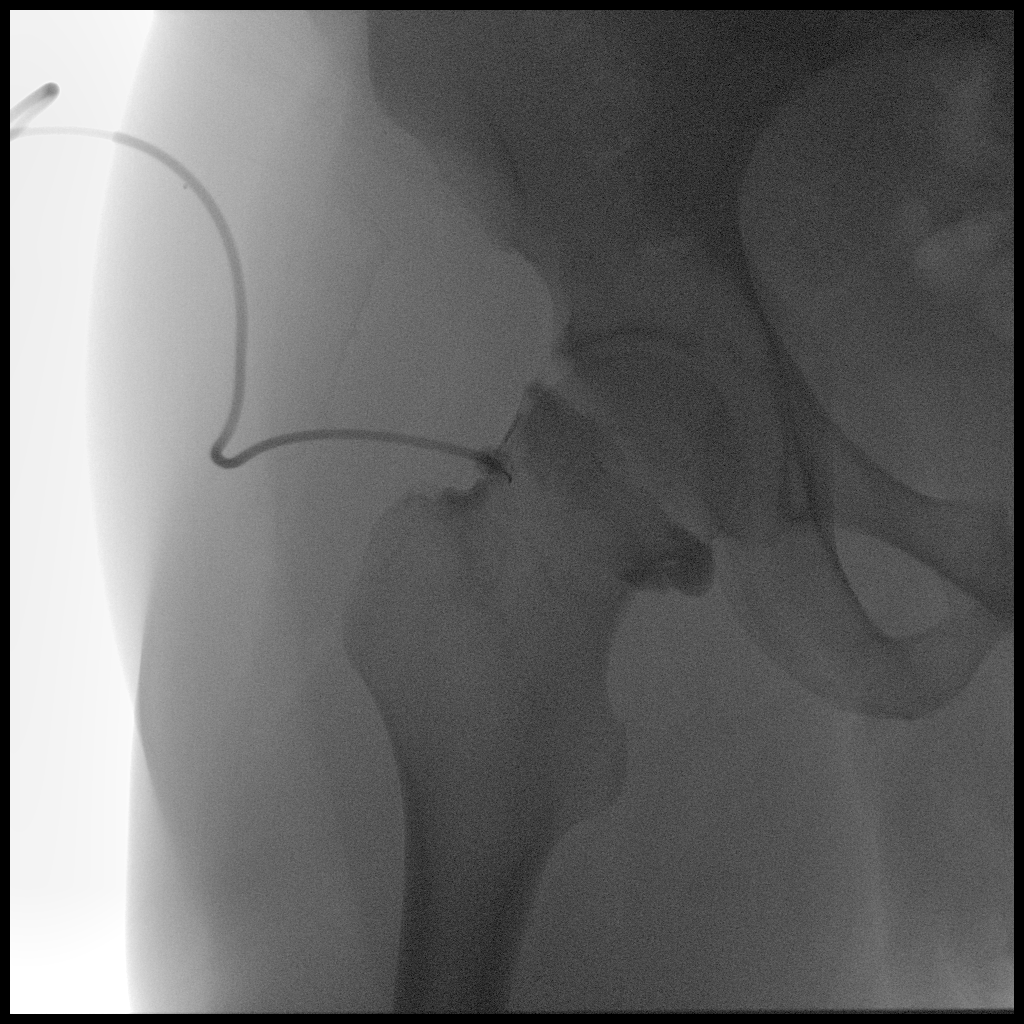

[5 of 5 positions shown; findings below may reference images not displayed]

EXAM:
RIGHT HIP ASPIRATION UNDER FLUOROSCOPY

FLUOROSCOPY TIME:  Fluoroscopy Time:  1 minutes

Radiation Exposure Index (if provided by the fluoroscopic device):

Number of Acquired Spot Images: 0

PROCEDURE:
Overlying skin prepped with Betadine, draped in the usual sterile
fashion, and infiltrated locally with buffered Lidocaine. Curved 22
gauge spinal needle advanced to the superolateral margin of the
right femoral head. 1 ml of Lidocaine injected easily. Diagnostic
injection of iodinated contrast demonstrates intra-articular spread
without intravascular component.

There was immediate return of yellow fluid. Approximately 20 mL of
cloudy yellow fluid was able to be aspirated from the right hip
joint.
IMPRESSION: Technically successful right hip aspiration under fluoroscopic
guidance. Approximately 20 mL of yellow cloudy fluid was aspirated
and sent for laboratory evaluation.

## 2021-11-15 ENCOUNTER — Encounter (HOSPITAL_BASED_OUTPATIENT_CLINIC_OR_DEPARTMENT_OTHER): Payer: Self-pay | Admitting: Emergency Medicine

## 2021-11-15 ENCOUNTER — Other Ambulatory Visit: Payer: Self-pay

## 2021-11-15 ENCOUNTER — Emergency Department (HOSPITAL_BASED_OUTPATIENT_CLINIC_OR_DEPARTMENT_OTHER)
Admission: EM | Admit: 2021-11-15 | Discharge: 2021-11-15 | Disposition: A | Discharge status: Home / Self Care | Payer: 59 | Attending: Emergency Medicine | Admitting: Emergency Medicine

## 2021-11-15 ENCOUNTER — Emergency Department (HOSPITAL_BASED_OUTPATIENT_CLINIC_OR_DEPARTMENT_OTHER): Payer: 59

## 2021-11-15 DIAGNOSIS — K529 Noninfective gastroenteritis and colitis, unspecified: Secondary | ICD-10-CM | POA: Diagnosis not present

## 2021-11-15 DIAGNOSIS — K921 Melena: Secondary | ICD-10-CM | POA: Insufficient documentation

## 2021-11-15 DIAGNOSIS — R109 Unspecified abdominal pain: Secondary | ICD-10-CM | POA: Diagnosis present

## 2021-11-15 LAB — URINALYSIS, MICROSCOPIC (REFLEX)

## 2021-11-15 LAB — CBC
HCT: 45.3 % (ref 39.0–52.0)
Hemoglobin: 15.5 g/dL (ref 13.0–17.0)
MCH: 29.4 pg (ref 26.0–34.0)
MCHC: 34.2 g/dL (ref 30.0–36.0)
MCV: 86 fL (ref 80.0–100.0)
Platelets: 314 10*3/uL (ref 150–400)
RBC: 5.27 MIL/uL (ref 4.22–5.81)
RDW: 13.4 % (ref 11.5–15.5)
WBC: 15.3 10*3/uL — ABNORMAL HIGH (ref 4.0–10.5)
nRBC: 0 % (ref 0.0–0.2)

## 2021-11-15 LAB — URINALYSIS, ROUTINE W REFLEX MICROSCOPIC
Bilirubin Urine: NEGATIVE
Glucose, UA: NEGATIVE mg/dL
Ketones, ur: 80 mg/dL — AB
Leukocytes,Ua: NEGATIVE
Nitrite: NEGATIVE
Protein, ur: 100 mg/dL — AB
Specific Gravity, Urine: >=1.03 (ref 1.005–1.030)
pH: 6 (ref 5.0–8.0)

## 2021-11-15 LAB — LIPASE, BLOOD: Lipase: 24 U/L (ref 11–51)

## 2021-11-15 LAB — COMPREHENSIVE METABOLIC PANEL
ALT: 25 U/L (ref 0–44)
AST: 23 U/L (ref 15–41)
Albumin: 4.4 g/dL (ref 3.5–5.0)
Alkaline Phosphatase: 80 U/L (ref 38–126)
Anion gap: 11 (ref 5–15)
BUN: 15 mg/dL (ref 6–20)
CO2: 25 mmol/L (ref 22–32)
Calcium: 9.5 mg/dL (ref 8.9–10.3)
Chloride: 101 mmol/L (ref 98–111)
Creatinine, Ser: 1.06 mg/dL (ref 0.61–1.24)
GFR, Estimated: >60 mL/min (ref >60)
Glucose, Bld: 121 mg/dL — ABNORMAL HIGH (ref 70–99)
Potassium: 4.3 mmol/L (ref 3.5–5.1)
Sodium: 137 mmol/L (ref 135–145)
Total Bilirubin: 0.6 mg/dL (ref 0.3–1.2)
Total Protein: 8.4 g/dL — ABNORMAL HIGH (ref 6.5–8.1)

## 2021-11-15 LAB — OCCULT BLOOD X 1 CARD TO LAB, STOOL: Fecal Occult Bld: POSITIVE — AB

## 2021-11-15 LAB — TROPONIN I (HIGH SENSITIVITY): Troponin I (High Sensitivity): 3 ng/L (ref <18)

## 2021-11-15 MED ORDER — IOHEXOL 300 MG/ML  SOLN
100.0000 mL | Freq: Once | INTRAMUSCULAR | Status: AC | PRN
  Administered 2021-11-15: 100 mL via INTRAVENOUS

## 2021-11-15 MED ORDER — FENTANYL CITRATE PF 50 MCG/ML IJ SOSY
50.0000 ug | PREFILLED_SYRINGE | Freq: Once | INTRAMUSCULAR | Status: AC
  Administered 2021-11-15: 50 ug via INTRAVENOUS
  Filled 2021-11-15: qty 1

## 2021-11-15 MED ORDER — CIPROFLOXACIN HCL 500 MG PO TABS
500.0000 mg | ORAL_TABLET | Freq: Once | ORAL | Status: AC
  Administered 2021-11-15: 500 mg via ORAL
  Filled 2021-11-15: qty 1

## 2021-11-15 MED ORDER — CIPROFLOXACIN HCL 500 MG PO TABS: 500.0000 mg | ORAL_TABLET | Freq: Two times a day (BID) | ORAL | 0 refills | Status: AC

## 2021-11-15 MED ORDER — METRONIDAZOLE 500 MG PO TABS
500.0000 mg | ORAL_TABLET | Freq: Once | ORAL | Status: AC
  Administered 2021-11-15: 500 mg via ORAL
  Filled 2021-11-15: qty 1

## 2021-11-15 MED ORDER — ONDANSETRON HCL 4 MG/2ML IJ SOLN
4.0000 mg | Freq: Once | INTRAMUSCULAR | Status: AC
  Administered 2021-11-15: 4 mg via INTRAVENOUS
  Filled 2021-11-15: qty 2

## 2021-11-15 MED ORDER — LACTATED RINGERS IV BOLUS
1000.0000 mL | Freq: Once | INTRAVENOUS | Status: AC
  Administered 2021-11-15: 1000 mL via INTRAVENOUS

## 2021-11-15 MED ORDER — PANTOPRAZOLE SODIUM 20 MG PO TBEC: 20.0000 mg | DELAYED_RELEASE_TABLET | Freq: Every day | ORAL | 0 refills | Status: AC

## 2021-11-15 MED ORDER — METRONIDAZOLE 500 MG PO TABS: 500.0000 mg | ORAL_TABLET | Freq: Two times a day (BID) | ORAL | 0 refills | Status: AC

## 2021-11-15 MED ORDER — PANTOPRAZOLE SODIUM 40 MG IV SOLR
40.0000 mg | Freq: Once | INTRAVENOUS | Status: AC
  Administered 2021-11-15: 40 mg via INTRAVENOUS
  Filled 2021-11-15: qty 10

## 2021-11-15 NOTE — ED Provider Notes (Signed)
?[facility] EMERGENCY DEPARTMENT ?Provider Note ? ? ?CSN: [id number] ?Arrival date & time: 11/15/21  1115 ? ?  ? ?History ? ?Chief Complaint  ?Patient presents with  ? Abdominal Pain  ? ? ?Ronald Morales is a 46 y.o. male.  Presenting to the emergency department due to concern for abdominal pain.  Patient states that pain has been going on for about 2 days.  Has been associated with nausea vomiting and loose stools.  Has noted streaks of blood in vomit.  Has also noted blood from his rectum.  Cannot recall number of episodes of vomiting today.  Cannot recall number of bowel movements today.  Pain is throughout the entirety of his abdomen.  Moderate to severe.  No alleviating or aggravating factors. ? ?HPI ? ?  ? ?Home Medications ?Prior to Admission medications   ?Medication Sig Start Date End Date Taking? Authorizing Provider  ?ciprofloxacin (CIPRO) 500 MG tablet Take 1 tablet (500 mg total) by mouth every 12 (twelve) hours for 7 days. 11/15/21 11/22/21 Yes Latitia Housewright, Ellwood Dense, MD  ?metroNIDAZOLE (FLAGYL) 500 MG tablet Take 1 tablet (500 mg total) by mouth 2 (two) times daily. 11/15/21  Yes Lucrezia Starch, MD  ?pantoprazole (PROTONIX) 20 MG tablet Take 1 tablet (20 mg total) by mouth daily. 11/15/21  Yes Lucrezia Starch, MD  ?acetaminophen (TYLENOL) 500 MG tablet Take 1,000 mg by mouth every 6 (six) hours as needed for moderate pain or headache.    [provider]  ?dexamethasone (DECADRON) 4 MG tablet Take 1 tablet (4 mg total) by mouth 2 (two) times daily. 10/18/19   Virgel Manifold, MD  ?ibuprofen (ADVIL) 200 MG tablet Take 400 mg by mouth every 6 (six) hours as needed for headache or moderate pain.    [provider]  ?meloxicam (MOBIC) 15 MG tablet Take 0.5 tablets (7.5 mg total) by mouth daily. 10/18/19   Virgel Manifold, MD  ?methocarbamol (ROBAXIN) 500 MG tablet Take 1 tablet (500 mg total) by mouth every 6 (six) hours as needed for muscle spasms. 03/07/19   Nicholes Stairs, MD  ?ondansetron (ZOFRAN ODT) 4 MG disintegrating tablet Take 1 tablet (4 mg total) by mouth every 8 (eight) hours as needed. 03/07/19   Nicholes Stairs, MD  ?oxyCODONE (OXY IR/ROXICODONE) 5 MG immediate release tablet Take 1 tablet (5 mg total) by mouth every 4 (four) hours as needed for moderate pain or breakthrough pain. 03/07/19   Nicholes Stairs, MD  ?   ? ?Allergies    ?Patient has no known allergies.   ? ?Review of Systems   ?Review of Systems  ?Constitutional:  Negative for chills and fever.  ?HENT:  Negative for ear pain and sore throat.   ?Eyes:  Negative for pain and visual disturbance.  ?Respiratory:  Negative for cough and shortness of breath.   ?Cardiovascular:  Negative for chest pain and palpitations.  ?Gastrointestinal:  Positive for abdominal pain, blood in stool, nausea and vomiting.  ?Genitourinary:  Negative for dysuria and hematuria.  ?Musculoskeletal:  Negative for arthralgias and back pain.  ?Skin:  Negative for color change and rash.  ?Neurological:  Negative for seizures and syncope.  ?All other systems reviewed and are negative. ? ?Physical Exam ?Updated Vital Signs ?BP (!) 141/87   Pulse 75   Temp 98.7 ?F (37.1 ?C)   Resp 18   Ht 5\' 11"  (1.803 m)   Wt 79.4 kg   SpO2 97%   BMI 24.41 kg/m?  ?  Physical Exam ?Vitals and nursing note reviewed. Exam conducted with a chaperone present.  ?Constitutional:   ?   General: He is not in acute distress. ?   Appearance: He is well-developed.  ?HENT:  ?   Head: Normocephalic and atraumatic.  ?Eyes:  ?   Conjunctiva/sclera: Conjunctivae normal.  ?Cardiovascular:  ?   Rate and Rhythm: Normal rate and regular rhythm.  ?   Heart sounds: No murmur heard. ?Pulmonary:  ?   Effort: Pulmonary effort is normal. No respiratory distress.  ?   Breath sounds: Normal breath sounds.  ?Abdominal:  ?   General: Abdomen is flat.  ?   Palpations: Abdomen is soft.  ?   Tenderness: There is no abdominal tenderness.  ?Genitourinary: ?   Comments:  Rectum appears normal, no blood noted, no fissures or hemorrhoids noted, tech chaperone ?Musculoskeletal:     ?   General: No swelling.  ?   Cervical back: Neck supple.  ?Skin: ?   General: Skin is warm and dry.  ?   Capillary Refill: Capillary refill takes less than 2 seconds.  ?Neurological:  ?   Mental Status: He is alert.  ?Psychiatric:     ?   Mood and Affect: Mood normal.  ? ? ?ED Results / Procedures / Treatments   ?Labs ?(all labs ordered are listed, but only abnormal results are displayed) ?Labs Reviewed  ?COMPREHENSIVE METABOLIC PANEL - Abnormal; Notable for the following components:  ?    Result Value  ? Glucose, Bld 121 (*)   ? Total Protein 8.4 (*)   ? All other components within normal limits  ?CBC - Abnormal; Notable for the following components:  ? WBC 15.3 (*)   ? All other components within normal limits  ?URINALYSIS, ROUTINE W REFLEX MICROSCOPIC - Abnormal; Notable for the following components:  ? Hgb urine dipstick TRACE (*)   ? Ketones, ur 80 (*)   ? Protein, ur 100 (*)   ? All other components within normal limits  ?OCCULT BLOOD X 1 CARD TO LAB, STOOL - Abnormal; Notable for the following components:  ? Fecal Occult Bld POSITIVE (*)   ? All other components within normal limits  ?URINALYSIS, MICROSCOPIC (REFLEX) - Abnormal; Notable for the following components:  ? Bacteria, UA MANY (*)   ? All other components within normal limits  ?LIPASE, BLOOD  ?TROPONIN I (HIGH SENSITIVITY)  ? ? ?EKG ?EKG Interpretation ? ?Date/Time:  Saturday November 15 2021 11:49:08 EDT ?Ventricular Rate:  74 ?PR Interval:  152 ?QRS Duration: 96 ?QT Interval:  366 ?QTC Calculation: 406 ?R Axis:   69 ?Text Interpretation: Normal sinus rhythm Minimal voltage criteria for LVH, may be normal variant ( Sokolow-Lyon ) Nonspecific ST and T wave abnormality Abnormal ECG When compared with ECG of 03-Mar-2019 09:55, PREVIOUS ECG IS PRESENT Confirmed by Marianna Fuss ([id number]) on 11/15/2021 1:38:44 PM ? ?Radiology ?CT ABDOMEN PELVIS W  CONTRAST ? ?Result Date: 11/15/2021 ?CLINICAL DATA:  Right lower quadrant pain. Patient reports nausea and vomiting. Rectal bleeding. EXAM: CT ABDOMEN AND PELVIS WITH CONTRAST TECHNIQUE: Multidetector CT imaging of the abdomen and pelvis was performed using the standard protocol following bolus administration of intravenous contrast. RADIATION DOSE REDUCTION: This exam was performed according to the departmental dose-optimization program which includes automated exposure control, adjustment of the mA and/or kV according to patient size and/or use of iterative reconstruction technique. CONTRAST:  OMNIPAQUE IOHEXOL 300 MG/ML  SOLN COMPARISON:  None. FINDINGS: Lower chest: Mild elevation of  left hemidiaphragm. No focal airspace disease or pleural effusion. Hepatobiliary: No focal liver abnormality is seen. No gallstones, gallbladder wall thickening, or biliary dilatation. Pancreas: Unremarkable. No pancreatic ductal dilatation or surrounding inflammatory changes. Spleen: Normal in size without focal abnormality. Adrenals/Urinary Tract: Normal adrenal glands. No hydronephrosis. Marked right renal atrophy. There are punctate nonobstructing stones in the upper right kidney. Right renal perfusion is heterogeneous. There is compensatory hypertrophy of the left kidney. Left renal hypertrophy is homogeneous. No focal left renal abnormality or left renal stone. There is excretion of contrast from both renal collecting systems, although more prominent on the left. Urinary bladder is nondistended, bladder wall thickening the dome may be related to nondistention. Stomach/Bowel: Small hiatal hernia. The stomach is otherwise unremarkable. No small bowel obstruction or inflammatory change. The appendix well visualized and normal. The terminal ileum is normal. There is edematous colonic wall thickening and pericolonic edema from the splenic flexure the descending colon. Lesser sigmoid involvement. Liquid stool in the left colon.  No diverticular disease or pneumatosis. Vascular/Lymphatic: Mild aorto bi-iliac atherosclerosis. No aortic aneurysm. Patent portal, splenic, and mesenteric veins. Small caliber right renal artery. No adenopathy. Repro

## 2021-11-15 NOTE — Discharge Instructions (Signed)
Please follow-up with a gastroenterologist regarding your symptoms today.  Recommend taking course of antibiotics.  Additionally recommend starting pantoprazole.  If you have further episodes of blood in vomit, blood in stool, worsening abdominal pain, or other new concerning symptom, come back to ER for reassessment. ?

## 2021-11-15 NOTE — ED Triage Notes (Signed)
Pt arrives pov, slow gait c/o lower abdominal pain with n/v, constipation and rectal bleeding x 2 days. ?

## 2021-11-15 NOTE — ED Notes (Signed)
Patient is unable to urinate at this time 

## 2022-06-18 ENCOUNTER — Encounter (HOSPITAL_BASED_OUTPATIENT_CLINIC_OR_DEPARTMENT_OTHER): Payer: Self-pay | Admitting: Urology

## 2022-06-18 ENCOUNTER — Emergency Department (HOSPITAL_BASED_OUTPATIENT_CLINIC_OR_DEPARTMENT_OTHER)
Admission: EM | Admit: 2022-06-18 | Discharge: 2022-06-18 | Disposition: A | Discharge status: Home / Self Care | Payer: 59 | Attending: Emergency Medicine | Admitting: Emergency Medicine

## 2022-06-18 ENCOUNTER — Other Ambulatory Visit: Payer: Self-pay

## 2022-06-18 DIAGNOSIS — I10 Essential (primary) hypertension: Secondary | ICD-10-CM | POA: Insufficient documentation

## 2022-06-18 DIAGNOSIS — R202 Paresthesia of skin: Secondary | ICD-10-CM | POA: Insufficient documentation

## 2022-06-18 MED ORDER — IBUPROFEN 800 MG PO TABS: 800.0000 mg | ORAL_TABLET | Freq: Three times a day (TID) | ORAL | 0 refills | Status: AC | PRN

## 2022-06-18 NOTE — ED Triage Notes (Signed)
Pt states left hand/finger tingling that radiates up to the left elbow x 2-3 weeks  Tingling worse in 1st and 2nd digit  Pt states repetitive motions with same hand at work

## 2022-06-18 NOTE — Discharge Instructions (Addendum)
Your symptoms are likely related to nerve entrapment. Please take ibuprofen 800 mg as needed up to 3 times a day for pain. A referral has been placed with sports medicine for further evaluation and treatment. Please reach out to them to schedule an appointment.

## 2022-06-18 NOTE — ED Provider Notes (Signed)
[facility] EMERGENCY DEPARTMENT Provider Note   CSN: [id number] Arrival date & time: 06/18/22  1309     History  Chief Complaint  Patient presents with   Hand tingling    Ronald Morales is a 46 y.o. male. Past Medical History:  Diagnosis Date   Arthritis    Difficult airway 03/07/2019   Grade 3 DL view. Successful Glidescope on 03/07/19. Stephannie Peters, MD   Hypertension    July 2020, not currently taking meds    HPI Numbness and tingling for 3 weeks extending from left elbow to fingers. Present constantly. Made worse by using arm. Left index finger and part of middle finger are numb. Endorses some sharp, burning pains. No injury to neck, shoulder or extremity. Denies any weakness. 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 Has to reach up above repetitively. Can't identify any alleviating factors. Hasn't tried any medication. Has been taking B12. Endorses history of blood clot in left leg. Denies any numbness or weakness elsewhere.    Home Medications Prior to Admission medications   Medication Sig Start Date End Date Taking? Authorizing Provider  ibuprofen (ADVIL) 800 MG tablet Take 1 tablet (800 mg total) by mouth every 8 (eight) hours as needed for moderate pain. 06/18/22  Yes Long, Arlyss Repress, MD  acetaminophen (TYLENOL) 500 MG tablet Take 1,000 mg by mouth every 6 (six) hours as needed for moderate pain or headache.    [provider]  dexamethasone (DECADRON) 4 MG tablet Take 1 tablet (4 mg total) by mouth 2 (two) times daily. 10/18/19   Raeford Razor, MD  meloxicam (MOBIC) 15 MG tablet Take 0.5 tablets (7.5 mg total) by mouth daily. 10/18/19   Raeford Razor, MD  methocarbamol (ROBAXIN) 500 MG tablet Take 1 tablet (500 mg total) by mouth every 6 (six) hours as needed for muscle spasms. 03/07/19   Yolonda Kida, MD  metroNIDAZOLE (FLAGYL) 500 MG tablet Take 1 tablet (500 mg total) by mouth 2 (two)  times daily. 11/15/21   Milagros Loll, MD  ondansetron (ZOFRAN ODT) 4 MG disintegrating tablet Take 1 tablet (4 mg total) by mouth every 8 (eight) hours as needed. 03/07/19   Yolonda Kida, MD  oxyCODONE (OXY IR/ROXICODONE) 5 MG immediate release tablet Take 1 tablet (5 mg total) by mouth every 4 (four) hours as needed for moderate pain or breakthrough pain. 03/07/19   Yolonda Kida, MD  pantoprazole (PROTONIX) 20 MG tablet Take 1 tablet (20 mg total) by mouth daily. 11/15/21   Milagros Loll, MD      Allergies    Patient has no known allergies.    Review of Systems   Review of Systems  Physical Exam Updated Vital Signs BP (!) 150/96 (BP Location: Right Arm)   Pulse 80   Temp 97.9 F (36.6 C) (Oral)   Resp 18   Ht 5\' 11"  (1.803 m)   Wt 79.4 kg   SpO2 98%   BMI 24.41 kg/m  Physical Exam Constitutional:      General: He is not in acute distress. HENT:     Head: Normocephalic and atraumatic.  Eyes:     Extraocular Movements: Extraocular movements intact.  Pulmonary:     Effort: Pulmonary effort is normal.  Musculoskeletal:     Comments: Left upper extremity without increased warmth, circumference, or erythema. Phalen maneuver equivocal. Ganglion cyst at radial aspect of left ventral wrist.  Neurological:  Mental Status: He is alert.     ED Results / Procedures / Treatments   Labs (all labs ordered are listed, but only abnormal results are displayed) Labs Reviewed - No data to display  EKG None  Radiology No results found.  Procedures Procedures    Medications Ordered in ED Medications - No data to display  ED Course/ Medical Decision Making/ A&P                           Medical Decision Making  Patient presents with 3 weeks of numbness and tingling at left upper extremity extending from triceps area to fingers, predominating in median distribution. No signs of DVT on exam. Also with what appears to be a ganglion cyst which would  correspond with distribution of symptoms. Differential includes nerve entrapment at neck, shoulder or elbow, ganglion cyst compressing nerve, brachial plexopathy, MS, stroke, B12 def, thoracic outlet obstruction. Feel most likely nerve entrapment at shoulder or elbow given history and exam. Reproducible with certain movements and seems to follow a dermatomal distribution. Low suspicion for stroke given absence of other symptoms. Referral placed with sports medicine for further evaluation and treatment.        Final Clinical Impression(s) / ED Diagnoses Final diagnoses:  Paresthesias    Rx / DC Orders ED Discharge Orders          Ordered    ibuprofen (ADVIL) 800 MG tablet  Every 8 hours PRN        06/18/22 1603              Adron Bene, MD 06/18/22 1607    Maia Plan, MD 06/19/22 1016

## 2022-06-22 ENCOUNTER — Ambulatory Visit (INDEPENDENT_AMBULATORY_CARE_PROVIDER_SITE_OTHER): Payer: 59 | Admitting: Family Medicine

## 2022-06-22 ENCOUNTER — Encounter: Payer: Self-pay | Admitting: Family Medicine

## 2022-06-22 VITALS — BP 138/78 | Ht 70.0 in | Wt 170.0 lb

## 2022-06-22 DIAGNOSIS — M5412 Radiculopathy, cervical region: Secondary | ICD-10-CM | POA: Diagnosis not present

## 2022-06-22 MED ORDER — PREDNISONE 5 MG PO TABS: ORAL_TABLET | ORAL | 0 refills | Status: AC

## 2022-06-22 NOTE — Patient Instructions (Signed)
Nice to meet you ?Please try heat  ?Please try the exercises   ?Please send me a message in MyChart with any questions or updates.  ?Please see me back in 3 weeks.  ? ?--Dr. Aniah Pauli ? ?

## 2022-06-22 NOTE — Assessment & Plan Note (Signed)
Acutely occurring.  Symptoms more consistent with a radicular type pattern.  No swelling or color changes to suggest a blood clot.  Has good strength and motion on exam. -Counseled on home exercise therapy and supportive care. -Prednisone -Could consider further imaging or physical therapy.

## 2022-06-22 NOTE — Progress Notes (Signed)
  Ronald Morales - 46 y.o. male MRN [id number]  Date of birth: [birth year]  SUBJECTIVE:  Including CC & ROS.  No chief complaint on file.   Ronald Morales is a 46 y.o. male that is presenting with sensation of the left arm and first 3 digits of the left hand.  Ongoing for the past few weeks.  He does repetitive activity with work.  No specific injury.  No history of surgery.  Review of the emergency department note from 11/30 shows he was given ibuprofen.   Review of Systems See HPI   HISTORY: Past Medical, Surgical, Social, and Family History Reviewed & Updated per EMR.   Pertinent Historical Findings include:  Past Medical History:  Diagnosis Date   Arthritis    Difficult airway 03/07/2019   Grade 3 DL view. Successful Glidescope on 03/07/19. Stephannie Peters, MD   Hypertension    July 2020, not currently taking meds    Past Surgical History:  Procedure Laterality Date   HIP ARTHROSCOPY Right 03/07/2019   Procedure: Right hip arthroscopy with debridementl and  femoroplasty;  Surgeon: Yolonda Kida, MD;  Location: [facility] OR;  Service: Orthopedics;  Laterality: Right;  2.5 hrs   KIDNEY SURGERY       PHYSICAL EXAM:  VS: BP 138/78   Ht 5\' 10"  (1.778 m)   Wt 170 lb (77.1 kg)   BMI 24.39 kg/m  Physical Exam Gen: NAD, alert, cooperative with exam, well-appearing MSK:  Neurovascularly intact       ASSESSMENT & PLAN:   Cervical radiculopathy Acutely occurring.  Symptoms more consistent with a radicular type pattern.  No swelling or color changes to suggest a blood clot.  Has good strength and motion on exam. -Counseled on home exercise therapy and supportive care. -Prednisone -Could consider further imaging or physical therapy.

## 2022-07-16 ENCOUNTER — Ambulatory Visit: Payer: Managed Care, Other (non HMO) | Admitting: Family Medicine

## 2022-07-21 ENCOUNTER — Ambulatory Visit (INDEPENDENT_AMBULATORY_CARE_PROVIDER_SITE_OTHER): Payer: 59 | Admitting: Family Medicine

## 2022-07-21 ENCOUNTER — Ambulatory Visit (HOSPITAL_BASED_OUTPATIENT_CLINIC_OR_DEPARTMENT_OTHER)
Admission: RE | Admit: 2022-07-21 | Discharge: 2022-07-21 | Disposition: A | Discharge status: Home / Self Care | Payer: 59 | Source: Ambulatory Visit | Attending: Family Medicine | Admitting: Family Medicine

## 2022-07-21 ENCOUNTER — Encounter: Payer: Self-pay | Admitting: Family Medicine

## 2022-07-21 VITALS — BP 120/72 | Ht 70.0 in | Wt 170.0 lb

## 2022-07-21 DIAGNOSIS — M5412 Radiculopathy, cervical region: Secondary | ICD-10-CM | POA: Insufficient documentation

## 2022-07-21 MED ORDER — GABAPENTIN 300 MG PO CAPS: 300.0000 mg | ORAL_CAPSULE | Freq: Three times a day (TID) | ORAL | 1 refills | Status: AC

## 2022-07-21 NOTE — Assessment & Plan Note (Signed)
Acutely occurring.  Having a radicular pattern of his symptoms with paresthesia. -Counseled on home exercise therapy and supportive care. -X-ray. -Referral to physical therapy. -Gabapentin. -Could consider further imaging.

## 2022-07-21 NOTE — Patient Instructions (Signed)
Good to see you Please continue heat  We'll call with the xray results.  We have made a referral to physical therapy  Please start with gabapentin at night. You can increase to 2 or 3 times daily as you tolerate   Please send me a message in MyChart with any questions or updates.  Please see me back in 3-4 weeks.   --Dr. Raeford Razor

## 2022-07-21 NOTE — Progress Notes (Signed)
  Ronald Morales - 47 y.o. male MRN [id number]  Date of birth: 04-16-1976  SUBJECTIVE:  Including CC & ROS.  No chief complaint on file.   Ronald Morales is a 47 y.o. male that is following up for his radicular pain.  He continues to have symptoms on the left side.  He notices the origin is at the neck and he feels it in the first and second digit of the left hand.  Not as much pain but more altered sensation.   Review of Systems See HPI   HISTORY: Past Medical, Surgical, Social, and Family History Reviewed & Updated per EMR.   Pertinent Historical Findings include:  Past Medical History:  Diagnosis Date   Arthritis    Difficult airway 03/07/2019   Grade 3 DL view. Successful Glidescope on 03/07/19. Daiva Huge, MD   Hypertension    July 2020, not currently taking meds    Past Surgical History:  Procedure Laterality Date   HIP ARTHROSCOPY Right 03/07/2019   Procedure: Right hip arthroscopy with debridementl and  femoroplasty;  Surgeon: Nicholes Stairs, MD;  Location: [facility];  Service: Orthopedics;  Laterality: Right;  2.5 hrs   KIDNEY SURGERY       PHYSICAL EXAM:  VS: BP 120/72 (BP Location: Left Arm, Patient Position: Sitting)   Ht 5\' 10"  (1.778 m)   Wt 170 lb (77.1 kg)   BMI 24.39 kg/m  Physical Exam Gen: NAD, alert, cooperative with exam, well-appearing MSK:  Neurovascularly intact       ASSESSMENT & PLAN:   Cervical radiculopathy Acutely occurring.  Having a radicular pattern of his symptoms with paresthesia. -Counseled on home exercise therapy and supportive care. -X-ray. -Referral to physical therapy. -Gabapentin. -Could consider further imaging.

## 2022-07-23 ENCOUNTER — Telehealth: Payer: Self-pay | Admitting: Family Medicine

## 2022-07-23 NOTE — Telephone Encounter (Signed)
Informed of results.   Rosemarie Ax, MD [facility] 07/23/2022, 8:26 AM

## 2022-08-11 ENCOUNTER — Ambulatory Visit: Payer: Managed Care, Other (non HMO) | Admitting: Family Medicine

## 2022-11-02 ENCOUNTER — Encounter: Payer: Self-pay | Admitting: *Deleted

## 2023-11-19 IMAGING — CT CT ABD-PELV W/ CM
2 of 5 series · 15 of 46 positions shown, 17 images · IV contrast (Omnipaque)
Comparison: None.

CLINICAL DATA: Right lower quadrant pain. Patient reports nausea
and vomiting. Rectal bleeding.

EXAM:
CT ABDOMEN AND PELVIS WITH CONTRAST
TECHNIQUE: Multidetector CT imaging of the abdomen and pelvis was performed
using the standard protocol following bolus administration of
intravenous contrast.

[Series 2: axial st · axial · 0.80mm/px · z∈[+708,+1132]mm · 12 of 96 slices shown, 14 images]
[im 6/96  soft-tissue]
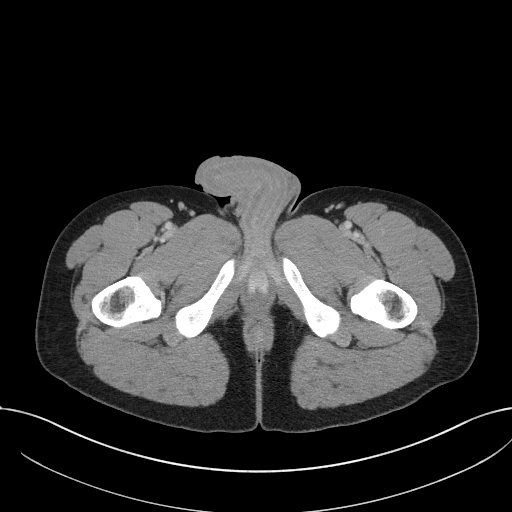
[im 6/96  bone]
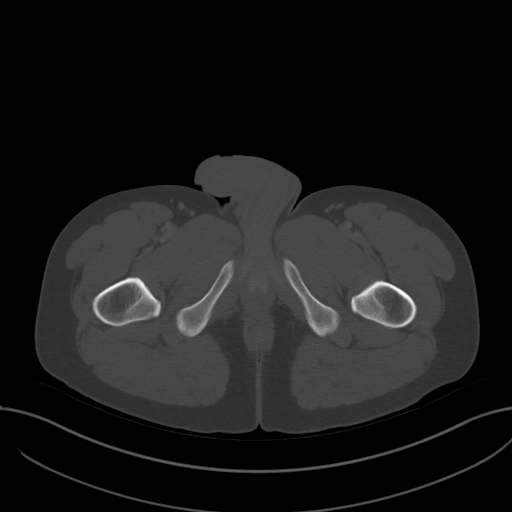
[im 16/96  soft-tissue]
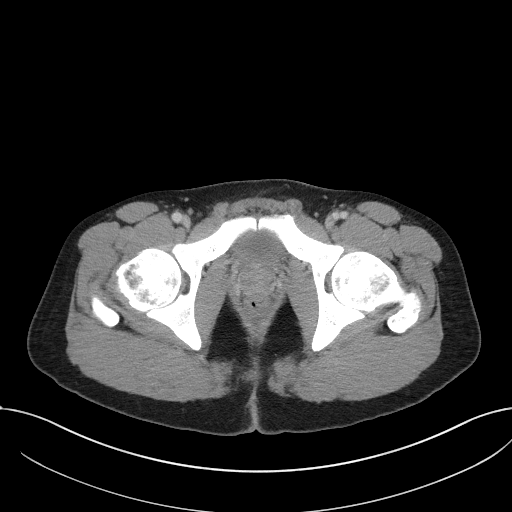
[im 21/96  soft-tissue]
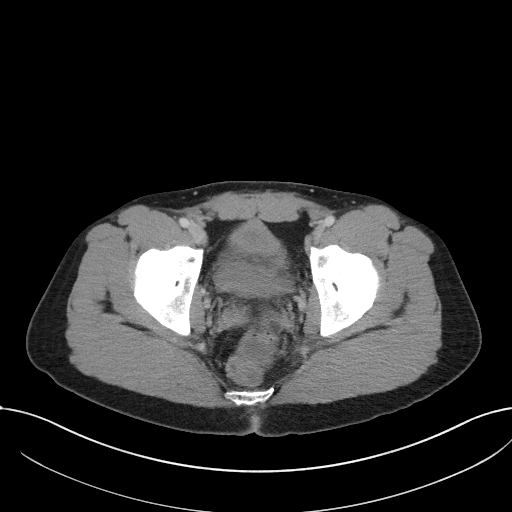
[im 31/96  soft-tissue]
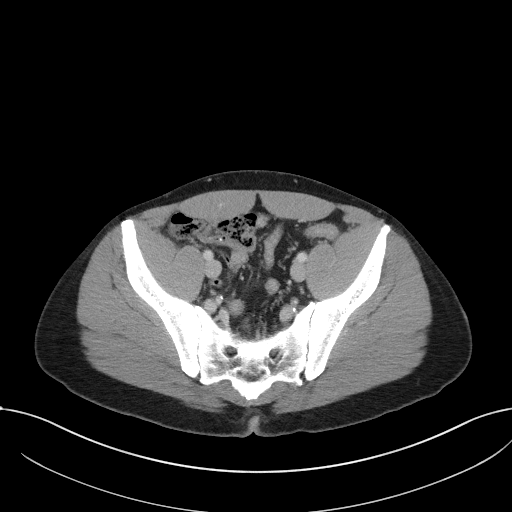
[im 36/96  soft-tissue]
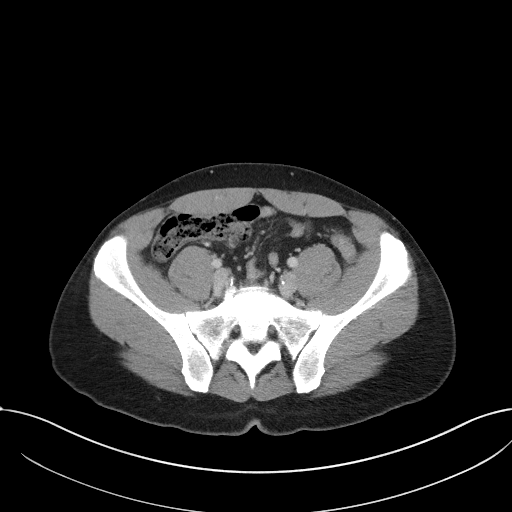
[im 46/96  soft-tissue]
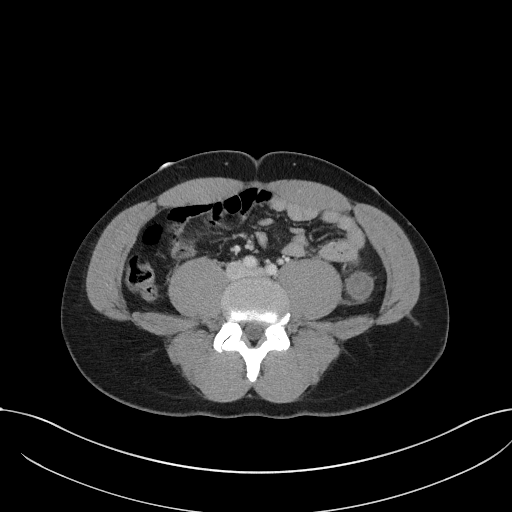
[im 51/96  soft-tissue]
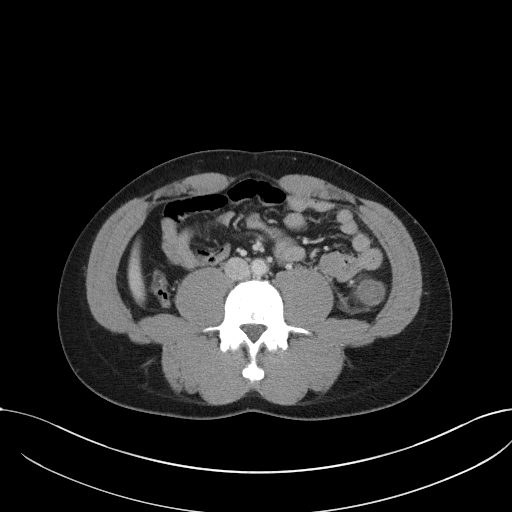
[im 61/96  soft-tissue]
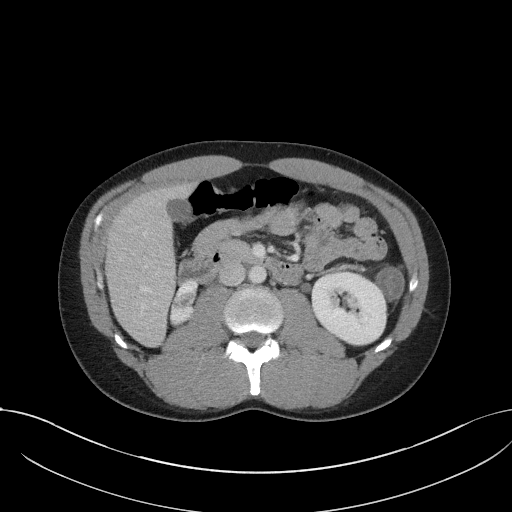
[im 66/96  soft-tissue]
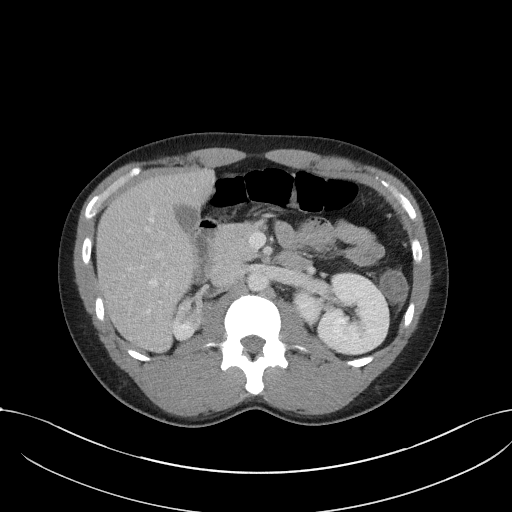
[im 66/96  bone]
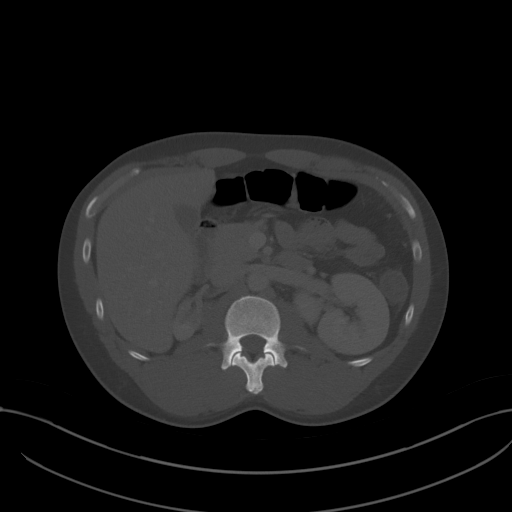
[im 76/96  soft-tissue]
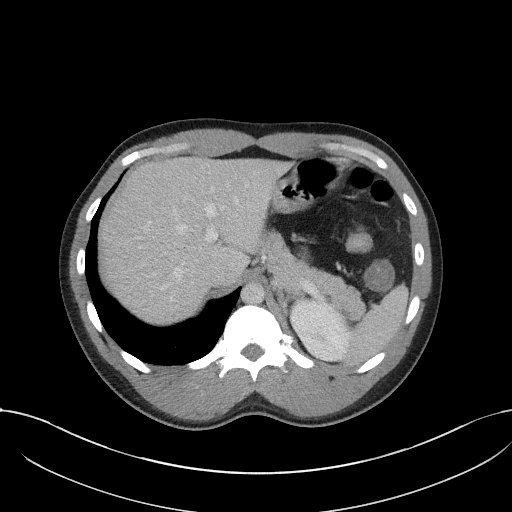
[im 81/96  soft-tissue]
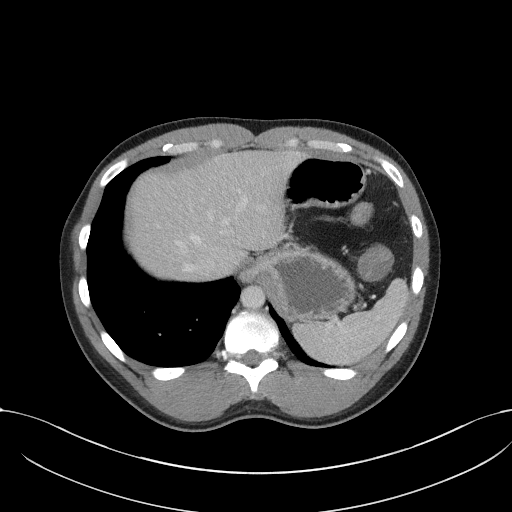
[im 91/96  soft-tissue]
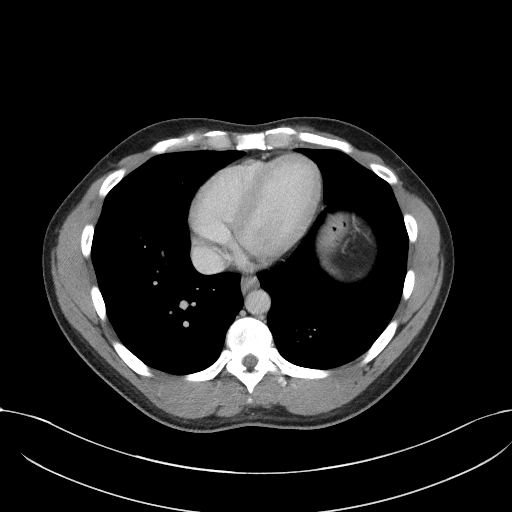

[Series 5: coronal st · coronal · 0.79mm/px · 3 of 83 slices shown]
[im 28/83  soft-tissue]
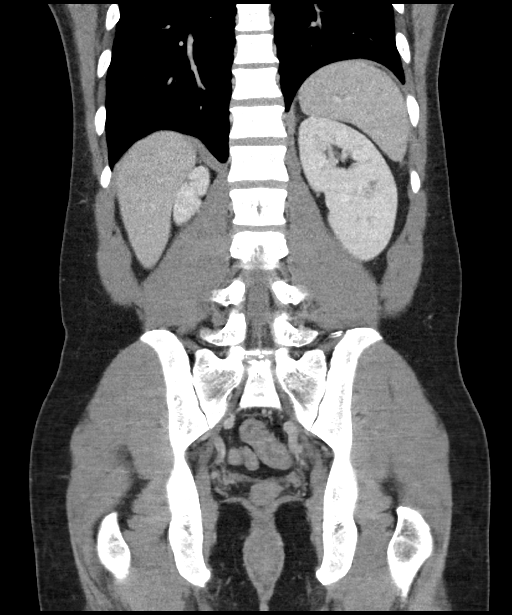
[im 37/83  soft-tissue]
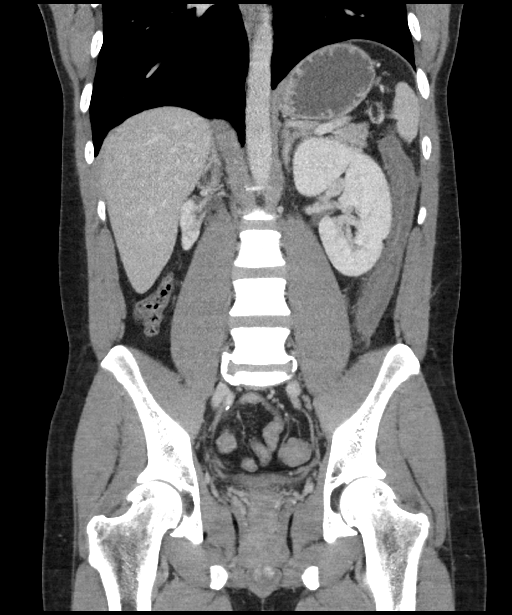
[im 46/83  soft-tissue]
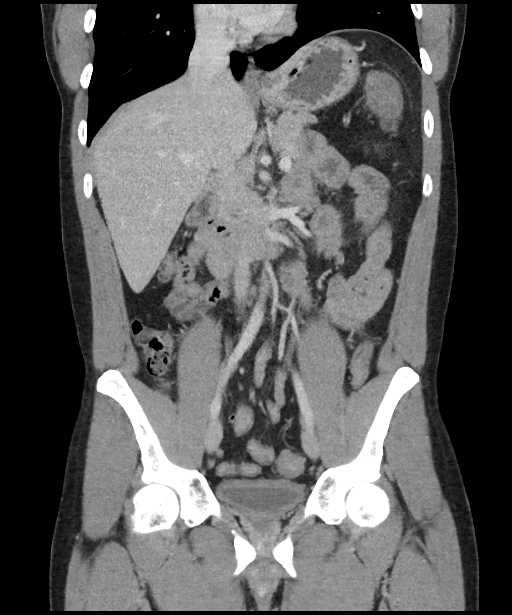

[15 of 46 positions shown; findings below may reference images not displayed]

RADIATION DOSE REDUCTION: This exam was performed according to the
departmental dose-optimization program which includes automated
exposure control, adjustment of the mA and/or kV according to
patient size and/or use of iterative reconstruction technique.

CONTRAST:  100mL OMNIPAQUE IOHEXOL 300 MG/ML  SOLN
FINDINGS: Lower chest: Mild elevation of left hemidiaphragm. No focal airspace
disease or pleural effusion.

Hepatobiliary: No focal liver abnormality is seen. No gallstones,
gallbladder wall thickening, or biliary dilatation.

Pancreas: Unremarkable. No pancreatic ductal dilatation or
surrounding inflammatory changes.

Spleen: Normal in size without focal abnormality.

Adrenals/Urinary Tract: Normal adrenal glands. No hydronephrosis.
Marked right renal atrophy. There are punctate nonobstructing stones
in the upper right kidney. Right renal perfusion is heterogeneous.
There is compensatory hypertrophy of the left kidney. Left renal
hypertrophy is homogeneous. No focal left renal abnormality or left
renal stone. There is excretion of contrast from both renal
collecting systems, although more prominent on the left. Urinary
bladder is nondistended, bladder wall thickening the dome may be
related to nondistention.

Stomach/Bowel: Small hiatal hernia. The stomach is otherwise
unremarkable. No small bowel obstruction or inflammatory change. The
appendix well visualized and normal. The terminal ileum is normal.
There is edematous colonic wall thickening and pericolonic edema
from the splenic flexure the descending colon. Lesser sigmoid
involvement. Liquid stool in the left colon. No diverticular disease
or pneumatosis.

Vascular/Lymphatic: Mild aorto bi-iliac atherosclerosis. No aortic
aneurysm. Patent portal, splenic, and mesenteric veins. Small
caliber right renal artery. No adenopathy.

Reproductive: Prostate is unremarkable.

Other: Fat stranding adjacent to the left colon with trace non
organized free fluid in the pericolic gutter. No ascites. No free
air. No abdominal wall hernia.

Musculoskeletal: Degenerative disc disease at L5-S1. There are no
acute or suspicious osseous abnormalities.
IMPRESSION: 1. Colitis from the splenic flexure to the descending colon, likely
infectious or inflammatory.
2. Marked right renal atrophy with heterogeneous perfusion. Punctate
nonobstructing stones in the upper right kidney.
3. Small hiatal hernia.

Aortic Atherosclerosis ([id number]-GAX.X).
